# Patient Record
Sex: Male | Born: 1955 | Race: White | Hispanic: No | Smoking: Former smoker
Health system: Southern US, Community
[De-identification: ages and names within clinical notes are randomized; demographics above are authoritative.]

## PROBLEM LIST (undated history)

## (undated) DIAGNOSIS — Z9001 Acquired absence of eye: Secondary | ICD-10-CM

## (undated) DIAGNOSIS — N390 Urinary tract infection, site not specified: Secondary | ICD-10-CM

## (undated) DIAGNOSIS — I82409 Acute embolism and thrombosis of unspecified deep veins of unspecified lower extremity: Secondary | ICD-10-CM

## (undated) DIAGNOSIS — R410 Disorientation, unspecified: Secondary | ICD-10-CM

## (undated) DIAGNOSIS — F1010 Alcohol abuse, uncomplicated: Secondary | ICD-10-CM

## (undated) DIAGNOSIS — J449 Chronic obstructive pulmonary disease, unspecified: Secondary | ICD-10-CM

## (undated) DIAGNOSIS — R1310 Dysphagia, unspecified: Secondary | ICD-10-CM

## (undated) DIAGNOSIS — G9341 Metabolic encephalopathy: Secondary | ICD-10-CM

## (undated) DIAGNOSIS — R0600 Dyspnea, unspecified: Secondary | ICD-10-CM

## (undated) DIAGNOSIS — J398 Other specified diseases of upper respiratory tract: Secondary | ICD-10-CM

## (undated) DIAGNOSIS — I739 Peripheral vascular disease, unspecified: Secondary | ICD-10-CM

## (undated) DIAGNOSIS — E119 Type 2 diabetes mellitus without complications: Secondary | ICD-10-CM

## (undated) DIAGNOSIS — J9690 Respiratory failure, unspecified, unspecified whether with hypoxia or hypercapnia: Secondary | ICD-10-CM

## (undated) DIAGNOSIS — I471 Supraventricular tachycardia, unspecified: Secondary | ICD-10-CM

## (undated) DIAGNOSIS — I4891 Unspecified atrial fibrillation: Secondary | ICD-10-CM

## (undated) HISTORY — PX: BELOW KNEE LEG AMPUTATION: SUR23

## (undated) HISTORY — PX: PEG TUBE PLACEMENT: SUR1034

## (undated) HISTORY — PX: TRACHEOSTOMY: SUR1362

---

## 2016-04-21 ENCOUNTER — Emergency Department (HOSPITAL_COMMUNITY): Payer: Medicare Other

## 2016-04-21 ENCOUNTER — Emergency Department (HOSPITAL_COMMUNITY)
Admission: EM | Admit: 2016-04-21 | Discharge: 2016-04-21 | Disposition: A | Discharge status: Home / Self Care | Payer: Medicare Other | Attending: Emergency Medicine | Admitting: Emergency Medicine

## 2016-04-21 ENCOUNTER — Encounter (HOSPITAL_COMMUNITY): Payer: Self-pay | Admitting: Emergency Medicine

## 2016-04-21 DIAGNOSIS — E119 Type 2 diabetes mellitus without complications: Secondary | ICD-10-CM | POA: Insufficient documentation

## 2016-04-21 DIAGNOSIS — Z87891 Personal history of nicotine dependence: Secondary | ICD-10-CM | POA: Insufficient documentation

## 2016-04-21 DIAGNOSIS — J449 Chronic obstructive pulmonary disease, unspecified: Secondary | ICD-10-CM | POA: Insufficient documentation

## 2016-04-21 DIAGNOSIS — Z4659 Encounter for fitting and adjustment of other gastrointestinal appliance and device: Secondary | ICD-10-CM | POA: Diagnosis not present

## 2016-04-21 DIAGNOSIS — Z431 Encounter for attention to gastrostomy: Secondary | ICD-10-CM

## 2016-04-21 HISTORY — DX: Type 2 diabetes mellitus without complications: E11.9

## 2016-04-21 HISTORY — DX: Respiratory failure, unspecified, unspecified whether with hypoxia or hypercapnia: J96.90

## 2016-04-21 HISTORY — DX: Metabolic encephalopathy: G93.41

## 2016-04-21 HISTORY — DX: Alcohol abuse, uncomplicated: F10.10

## 2016-04-21 HISTORY — DX: Unspecified atrial fibrillation: I48.91

## 2016-04-21 HISTORY — DX: Supraventricular tachycardia: I47.1

## 2016-04-21 HISTORY — DX: Other specified diseases of upper respiratory tract: J39.8

## 2016-04-21 HISTORY — DX: Supraventricular tachycardia, unspecified: I47.10

## 2016-04-21 HISTORY — DX: Acute embolism and thrombosis of unspecified deep veins of unspecified lower extremity: I82.409

## 2016-04-21 HISTORY — DX: Acquired absence of eye: Z90.01

## 2016-04-21 HISTORY — DX: Urinary tract infection, site not specified: N39.0

## 2016-04-21 HISTORY — DX: Dysphagia, unspecified: R13.10

## 2016-04-21 HISTORY — DX: Chronic obstructive pulmonary disease, unspecified: J44.9

## 2016-04-21 HISTORY — DX: Peripheral vascular disease, unspecified: I73.9

## 2016-04-21 HISTORY — DX: Disorientation, unspecified: R41.0

## 2016-04-21 LAB — BASIC METABOLIC PANEL
Anion gap: 14 (ref 5–15)
BUN: 38 mg/dL — ABNORMAL HIGH (ref 6–20)
CO2: 27 mmol/L (ref 22–32)
Calcium: 9.7 mg/dL (ref 8.9–10.3)
Chloride: 91 mmol/L — ABNORMAL LOW (ref 101–111)
Creatinine, Ser: 0.9 mg/dL (ref 0.61–1.24)
GFR calc Af Amer: >60 mL/min (ref >60)
GFR calc non Af Amer: >60 mL/min (ref >60)
Glucose, Bld: 187 mg/dL — ABNORMAL HIGH (ref 65–99)
Potassium: 4.6 mmol/L (ref 3.5–5.1)
Sodium: 132 mmol/L — ABNORMAL LOW (ref 135–145)

## 2016-04-21 LAB — CBC WITH DIFFERENTIAL/PLATELET
Basophils Absolute: 0 10*3/uL (ref 0.0–0.1)
Basophils Relative: 0 %
Eosinophils Absolute: 0.1 10*3/uL (ref 0.0–0.7)
Eosinophils Relative: 1 %
HCT: 38.2 % — ABNORMAL LOW (ref 39.0–52.0)
Hemoglobin: 12.5 g/dL — ABNORMAL LOW (ref 13.0–17.0)
Lymphocytes Relative: 24 %
Lymphs Abs: 1.8 10*3/uL (ref 0.7–4.0)
MCH: 28.5 pg (ref 26.0–34.0)
MCHC: 32.7 g/dL (ref 30.0–36.0)
MCV: 87 fL (ref 78.0–100.0)
Monocytes Absolute: 0.6 10*3/uL (ref 0.1–1.0)
Monocytes Relative: 8 %
Neutro Abs: 5.1 10*3/uL (ref 1.7–7.7)
Neutrophils Relative %: 67 %
Platelets: ADEQUATE 10*3/uL (ref 150–400)
RBC: 4.39 MIL/uL (ref 4.22–5.81)
RDW: 15 % (ref 11.5–15.5)
WBC: 7.6 10*3/uL (ref 4.0–10.5)

## 2016-04-21 LAB — CBG MONITORING, ED: Glucose-Capillary: 200 mg/dL — ABNORMAL HIGH (ref 65–99)

## 2016-04-21 MED ORDER — LIDOCAINE HCL 2 % EX GEL
1.0000 "application " | Freq: Once | CUTANEOUS | Status: AC
  Administered 2016-04-21: 1 via TOPICAL
  Filled 2016-04-21: qty 20

## 2016-04-21 MED ORDER — SODIUM CHLORIDE 0.9 % IV BOLUS (SEPSIS)
1000.0000 mL | Freq: Once | INTRAVENOUS | Status: AC
  Administered 2016-04-21: 1000 mL via INTRAVENOUS

## 2016-04-21 NOTE — ED Notes (Signed)
RT present to switch pt over to ED ventilator from [facility].

## 2016-04-21 NOTE — ED Notes (Signed)
Dr Madilyn Hookees made aware NG tube placement difficult due to patient needing to remain NPO, this RN requesting Lidocaine Jelly to do procedure. Dr. Madilyn Hookees verbalized order for lidocaine jelly to this RN.

## 2016-04-21 NOTE — ED Provider Notes (Signed)
[facility]-EMERGENCY DEPT Provider Note   CSN: [id number] Arrival date & time: 04/21/16  1239     History   Chief Complaint Chief Complaint  Patient presents with  . peg tube placement     HPI Eric Sandoval is a 60 y.o. male.  HPI   60 year old male presents today from [facility] for request for PEG tube placement. Patient is a 60 year old male with a significant past medical history of respiratory failure followed by intubation. Patient had a [facility]ach and PEG [facility]16/[facility] and failed multiple attempts at weaning off the ventilator.   Patient reports that 2-3 days ago his PEG tube came out, he is unable to provide any further information surrounding this. Patient reports he has not had any nutrition since then.   Past Medical History:  Diagnosis Date  . Alcohol abuse   . Atrial fibrillation (HCC)   . COPD (chronic obstructive pulmonary disease) (HCC)   . Delirium   . Diabetes mellitus without complication (HCC)   . DVT (deep venous thrombosis) (HCC)   . Dysphagia   . History of eye enucleation    RIGHT  . Metabolic encephalopathy   . Peripheral vascular disease (HCC)   . Respiratory failure (HCC)   . SVT (supraventricular tachycardia) (HCC)   . Tracheomalacia   . UTI (urinary tract infection)    VRE     There are no active problems to display for this patient.   Past Surgical History:  Procedure Laterality Date  . BELOW KNEE LEG AMPUTATION Right   . PEG TUBE PLACEMENT    . TRACHEOSTOMY       Home Medications    Prior to Admission medications   Not on File    Family History No family history on file.  Social History Social History  Substance Use Topics  . Smoking status: Former Games developermoker  . Smokeless tobacco: Not on file  . Alcohol use No     Comment: hx of alcohol abuse previously     Allergies   Review of patient's allergies indicates no known allergies.   Review of Systems Review of Systems    All other systems reviewed and are negative.   Physical Exam Updated Vital Signs BP 110/74   Pulse 95   Resp 18   SpO2 98%   Physical Exam  Constitutional: He is oriented to person, place, and time. He appears well-developed and well-nourished.  HENT:  Head: Normocephalic and atraumatic.  Eyes: Conjunctivae are normal. Pupils are equal, round, and reactive to light. Right eye exhibits no discharge. Left eye exhibits no discharge. No scleral icterus.  Neck: Normal range of motion. No JVD present. No tracheal deviation present.  Tracheostomy patent, no signs of surrounding infection  Cardiovascular: Normal rate.   Pulmonary/Chest: Effort normal. No stridor. No respiratory distress. He has no wheezes. He has no rales.  Abdominal: Soft.  Peg tube port closed, with signs of healing, no discharge or signs of infection   Neurological: He is alert and oriented to person, place, and time. Coordination normal.  Psychiatric: He has a normal mood and affect. His behavior is normal. Judgment and thought content normal.  Nursing note and vitals reviewed.   ED Treatments / Results  Labs (all labs ordered are listed, but only abnormal results are displayed) Labs Reviewed  CBC WITH DIFFERENTIAL/PLATELET - Abnormal; Notable for the following:       Result Value   Hemoglobin 12.5 (*)    HCT  38.2 (*)    All other components within normal limits  BASIC METABOLIC PANEL - Abnormal; Notable for the following:    Sodium 132 (*)    Chloride 91 (*)    Glucose, Bld 187 (*)    BUN 38 (*)    All other components within normal limits    EKG  EKG Interpretation None       Radiology No results found.  Procedures Procedures (including critical care time)  Medications Ordered in ED Medications  sodium chloride 0.9 % bolus 1,000 mL (not administered)     Initial Impression / Assessment and Plan / ED Course  I have reviewed the triage vital signs and the nursing notes.  Pertinent labs &  imaging results that were available during my care of the patient were reviewed by me and considered in my medical decision making (see chart for details).  Clinical Course     Final Clinical Impressions(s) / ED Diagnoses   Final diagnoses:  PEG (percutaneous endoscopic gastrostomy) adjustment/replacement/removal (HCC)    Labs: CBC, BMP  Imaging:   Consults:  Therapeutics:  Discharge Meds:   Assessment/Plan:  60 year old male [facility] for [facility]. I consult that interventional radiology. I spoke with Dr. Sofie Rower who instructed me to contact the secretary for interventional radiology and schedule an appointment for Monday. The scheduler was given information, she reports she would make contact with the patient and schedule procedure for Monday. Patient will have NG tube placed and tell PEG tube placement on Monday.   New Prescriptions New Prescriptions   No medications on file     Eyvonne Mechanic, PA-C 04/21/16 1628    Melene Plan, DO 04/22/16 [phone]

## 2016-04-21 NOTE — ED Notes (Signed)
This RN entered the room when patient's vent began to alarm, pt was found to be unhooked from vent. This RN reattached vent to patient, pts O2 sat 98. Pt then became unresponsive and diaphoretic with eyes rolled back in his head for approx 30 seconds. Pulses remained during episode. Pt appeared to have bowel incontinence during episode. RT in to check ventilator settings. CBG found to be 200, Dr. Madilyn Hookees made aware.

## 2016-04-21 NOTE — Progress Notes (Signed)
Chronic vent pt from [facility].  Matched pt's "home" settings on Servo.  SIMV/PRVC/PS, Vt 550, Rt 12, PS 16.  Pt comfortable.  Will continue to monitor.

## 2016-04-21 NOTE — ED Triage Notes (Signed)
[facility] is [facility] for [facility] Peg [facility] placement. Pt has PICC in right upper arm. Per [facility] pt had peg tube that came out 2-3 days ago.  Per examination site of pervious peg tube is scarred over.  Bowel sounds are present.  VSS NAD.

## 2016-04-21 NOTE — Discharge Instructions (Signed)
Patient was brought to the emergency room for PEG tube placement. Interventional radiology was consult at, they would not be able to place this tube today or this weekend. Patient will need to return on Monday for PEG tube placement. I have talked with secretary for interventional radiology who has patient's contact information. They will be contacting both U and patient's contact phone number to schedule placement on Monday. Patient has had nasogastric tube placed for feedings and medications. If you have any questions or concerns please contact the emergency room, if patient has any new or worsening signs or symptoms please have him return to the emergency room immediately.

## 2016-04-21 NOTE — ED Notes (Signed)
PA Hedges at bedside, pt seen unhooking vent connection from his trach by PA

## 2016-04-25 ENCOUNTER — Telehealth (HOSPITAL_COMMUNITY): Payer: Self-pay

## 2016-04-25 NOTE — Telephone Encounter (Signed)
Called to schedule peg tube placement. Sp[facility] with Pam [facility] and pt had peg tube placed today no need to schedule.

## 2016-05-15 ENCOUNTER — Inpatient Hospital Stay (HOSPITAL_COMMUNITY)
Admission: EM | Admit: 2016-05-15 | Discharge: 2016-05-19 | DRG: 853 | Disposition: A | Discharge status: Skilled Nursing Facility | Payer: Medicare Other | Attending: Internal Medicine | Admitting: Internal Medicine

## 2016-05-15 ENCOUNTER — Emergency Department (HOSPITAL_COMMUNITY): Payer: Medicare Other

## 2016-05-15 ENCOUNTER — Encounter (HOSPITAL_COMMUNITY): Payer: Self-pay

## 2016-05-15 DIAGNOSIS — K219 Gastro-esophageal reflux disease without esophagitis: Secondary | ICD-10-CM | POA: Diagnosis present

## 2016-05-15 DIAGNOSIS — R55 Syncope and collapse: Secondary | ICD-10-CM | POA: Diagnosis present

## 2016-05-15 DIAGNOSIS — Y95 Nosocomial condition: Secondary | ICD-10-CM | POA: Diagnosis present

## 2016-05-15 DIAGNOSIS — R6520 Severe sepsis without septic shock: Secondary | ICD-10-CM | POA: Diagnosis present

## 2016-05-15 DIAGNOSIS — D649 Anemia, unspecified: Secondary | ICD-10-CM | POA: Diagnosis present

## 2016-05-15 DIAGNOSIS — E119 Type 2 diabetes mellitus without complications: Secondary | ICD-10-CM

## 2016-05-15 DIAGNOSIS — J189 Pneumonia, unspecified organism: Secondary | ICD-10-CM | POA: Diagnosis present

## 2016-05-15 DIAGNOSIS — J440 Chronic obstructive pulmonary disease with acute lower respiratory infection: Secondary | ICD-10-CM | POA: Diagnosis present

## 2016-05-15 DIAGNOSIS — E1151 Type 2 diabetes mellitus with diabetic peripheral angiopathy without gangrene: Secondary | ICD-10-CM | POA: Diagnosis present

## 2016-05-15 DIAGNOSIS — Z9911 Dependence on respirator [ventilator] status: Secondary | ICD-10-CM | POA: Diagnosis not present

## 2016-05-15 DIAGNOSIS — T83518A Infection and inflammatory reaction due to other urinary catheter, initial encounter: Secondary | ICD-10-CM | POA: Diagnosis present

## 2016-05-15 DIAGNOSIS — Z931 Gastrostomy status: Secondary | ICD-10-CM

## 2016-05-15 DIAGNOSIS — L89159 Pressure ulcer of sacral region, unspecified stage: Secondary | ICD-10-CM | POA: Diagnosis present

## 2016-05-15 DIAGNOSIS — J9621 Acute and chronic respiratory failure with hypoxia: Secondary | ICD-10-CM | POA: Diagnosis present

## 2016-05-15 DIAGNOSIS — Z794 Long term (current) use of insulin: Secondary | ICD-10-CM

## 2016-05-15 DIAGNOSIS — E860 Dehydration: Secondary | ICD-10-CM | POA: Diagnosis present

## 2016-05-15 DIAGNOSIS — Y838 Other surgical procedures as the cause of abnormal reaction of the patient, or of later complication, without mention of misadventure at the time of the procedure: Secondary | ICD-10-CM | POA: Diagnosis present

## 2016-05-15 DIAGNOSIS — J9611 Chronic respiratory failure with hypoxia: Secondary | ICD-10-CM | POA: Diagnosis not present

## 2016-05-15 DIAGNOSIS — J9811 Atelectasis: Secondary | ICD-10-CM | POA: Insufficient documentation

## 2016-05-15 DIAGNOSIS — Z89511 Acquired absence of right leg below knee: Secondary | ICD-10-CM

## 2016-05-15 DIAGNOSIS — A419 Sepsis, unspecified organism: Secondary | ICD-10-CM | POA: Diagnosis present

## 2016-05-15 DIAGNOSIS — F329 Major depressive disorder, single episode, unspecified: Secondary | ICD-10-CM | POA: Diagnosis present

## 2016-05-15 DIAGNOSIS — T17890A Other foreign object in other parts of respiratory tract causing asphyxiation, initial encounter: Secondary | ICD-10-CM | POA: Diagnosis present

## 2016-05-15 DIAGNOSIS — L89154 Pressure ulcer of sacral region, stage 4: Secondary | ICD-10-CM | POA: Diagnosis present

## 2016-05-15 DIAGNOSIS — F419 Anxiety disorder, unspecified: Secondary | ICD-10-CM | POA: Diagnosis present

## 2016-05-15 DIAGNOSIS — F418 Other specified anxiety disorders: Secondary | ICD-10-CM | POA: Diagnosis not present

## 2016-05-15 DIAGNOSIS — B9620 Unspecified Escherichia coli [E. coli] as the cause of diseases classified elsewhere: Secondary | ICD-10-CM | POA: Diagnosis present

## 2016-05-15 DIAGNOSIS — Z87891 Personal history of nicotine dependence: Secondary | ICD-10-CM

## 2016-05-15 DIAGNOSIS — R0600 Dyspnea, unspecified: Secondary | ICD-10-CM

## 2016-05-15 DIAGNOSIS — Z930 Tracheostomy status: Secondary | ICD-10-CM

## 2016-05-15 DIAGNOSIS — I959 Hypotension, unspecified: Secondary | ICD-10-CM

## 2016-05-15 DIAGNOSIS — R1310 Dysphagia, unspecified: Secondary | ICD-10-CM | POA: Diagnosis present

## 2016-05-15 DIAGNOSIS — I480 Paroxysmal atrial fibrillation: Secondary | ICD-10-CM | POA: Diagnosis present

## 2016-05-15 DIAGNOSIS — Z86718 Personal history of other venous thrombosis and embolism: Secondary | ICD-10-CM

## 2016-05-15 DIAGNOSIS — J9622 Acute and chronic respiratory failure with hypercapnia: Secondary | ICD-10-CM | POA: Diagnosis present

## 2016-05-15 DIAGNOSIS — J9612 Chronic respiratory failure with hypercapnia: Secondary | ICD-10-CM

## 2016-05-15 DIAGNOSIS — N390 Urinary tract infection, site not specified: Secondary | ICD-10-CM

## 2016-05-15 DIAGNOSIS — A4151 Sepsis due to Escherichia coli [E. coli]: Secondary | ICD-10-CM | POA: Diagnosis not present

## 2016-05-15 DIAGNOSIS — Z79899 Other long term (current) drug therapy: Secondary | ICD-10-CM

## 2016-05-15 DIAGNOSIS — J90 Pleural effusion, not elsewhere classified: Secondary | ICD-10-CM

## 2016-05-15 DIAGNOSIS — F32A Depression, unspecified: Secondary | ICD-10-CM | POA: Diagnosis present

## 2016-05-15 DIAGNOSIS — Z7951 Long term (current) use of inhaled steroids: Secondary | ICD-10-CM

## 2016-05-15 HISTORY — DX: Dyspnea, unspecified: R06.00

## 2016-05-15 LAB — URINALYSIS, ROUTINE W REFLEX MICROSCOPIC
BILIRUBIN URINE: NEGATIVE
Glucose, UA: NEGATIVE mg/dL
Ketones, ur: 15 mg/dL — AB
Nitrite: POSITIVE — AB
PH: 5.5 (ref 5.0–8.0)
Protein, ur: 100 mg/dL — AB
SPECIFIC GRAVITY, URINE: 1.023 (ref 1.005–1.030)

## 2016-05-15 LAB — LACTIC ACID, PLASMA
LACTIC ACID, VENOUS: 1.8 mmol/L (ref 0.5–1.9)
LACTIC ACID, VENOUS: 0.7 mmol/L (ref 0.5–1.9)

## 2016-05-15 LAB — COMPREHENSIVE METABOLIC PANEL
ALBUMIN: 2.2 g/dL — AB (ref 3.5–5.0)
ALK PHOS: 138 U/L — AB (ref 38–126)
ALT: 16 U/L — ABNORMAL LOW (ref 17–63)
ANION GAP: 13 (ref 5–15)
AST: 24 U/L (ref 15–41)
BILIRUBIN TOTAL: 0.4 mg/dL (ref 0.3–1.2)
BUN: 39 mg/dL — AB (ref 6–20)
CALCIUM: 9.2 mg/dL (ref 8.9–10.3)
CO2: 30 mmol/L (ref 22–32)
Chloride: 90 mmol/L — ABNORMAL LOW (ref 101–111)
Creatinine, Ser: 0.73 mg/dL (ref 0.61–1.24)
GFR calc Af Amer: >60 mL/min (ref >60)
GLUCOSE: 143 mg/dL — AB (ref 65–99)
Potassium: 5.1 mmol/L (ref 3.5–5.1)
Sodium: 133 mmol/L — ABNORMAL LOW (ref 135–145)
TOTAL PROTEIN: 7.4 g/dL (ref 6.5–8.1)

## 2016-05-15 LAB — CBC WITH DIFFERENTIAL/PLATELET
BASOS PCT: 0 %
Basophils Absolute: 0 10*3/uL (ref 0.0–0.1)
Eosinophils Absolute: 0 10*3/uL (ref 0.0–0.7)
Eosinophils Relative: 0 %
HEMATOCRIT: 30.9 % — AB (ref 39.0–52.0)
HEMOGLOBIN: 9.7 g/dL — AB (ref 13.0–17.0)
LYMPHS ABS: 0.5 10*3/uL — AB (ref 0.7–4.0)
Lymphocytes Relative: 3 %
MCH: 28.4 pg (ref 26.0–34.0)
MCHC: 31.4 g/dL (ref 30.0–36.0)
MCV: 90.4 fL (ref 78.0–100.0)
MONO ABS: 0.7 10*3/uL (ref 0.1–1.0)
MONOS PCT: 4 %
NEUTROS ABS: 18 10*3/uL — AB (ref 1.7–7.7)
NEUTROS PCT: 93 %
Platelets: 384 10*3/uL (ref 150–400)
RBC: 3.42 MIL/uL — ABNORMAL LOW (ref 4.22–5.81)
RDW: 15.5 % (ref 11.5–15.5)
WBC: 19.2 10*3/uL — ABNORMAL HIGH (ref 4.0–10.5)

## 2016-05-15 LAB — PROCALCITONIN: PROCALCITONIN: 1.09 ng/mL

## 2016-05-15 LAB — URINE MICROSCOPIC-ADD ON

## 2016-05-15 LAB — I-STAT TROPONIN, ED: TROPONIN I, POC: 0.01 ng/mL (ref 0.00–0.08)

## 2016-05-15 LAB — I-STAT CG4 LACTIC ACID, ED: Lactic Acid, Venous: 1.74 mmol/L (ref 0.5–1.9)

## 2016-05-15 LAB — TROPONIN I: Troponin I: 0.16 ng/mL (ref <0.03)

## 2016-05-15 LAB — CBG MONITORING, ED: Glucose-Capillary: 152 mg/dL — ABNORMAL HIGH (ref 65–99)

## 2016-05-15 LAB — BRAIN NATRIURETIC PEPTIDE: B NATRIURETIC PEPTIDE 5: 241.7 pg/mL — AB (ref 0.0–100.0)

## 2016-05-15 MED ORDER — MIRTAZAPINE 15 MG PO TABS
15.0000 mg | ORAL_TABLET | Freq: Every day | ORAL | Status: DC
  Administered 2016-05-16 – 2016-05-18 (×4): 15 mg
  Filled 2016-05-15 (×6): qty 1

## 2016-05-15 MED ORDER — FENTANYL CITRATE (PF) 100 MCG/2ML IJ SOLN
50.0000 ug | Freq: Once | INTRAMUSCULAR | Status: AC
  Administered 2016-05-15: 50 ug via INTRAVENOUS
  Filled 2016-05-15: qty 2

## 2016-05-15 MED ORDER — SENNOSIDES-DOCUSATE SODIUM 8.6-50 MG PO TABS
1.0000 | ORAL_TABLET | Freq: Every day | ORAL | Status: DC
  Filled 2016-05-15: qty 1

## 2016-05-15 MED ORDER — CEFEPIME HCL 2 G IJ SOLR
2.0000 g | Freq: Three times a day (TID) | INTRAMUSCULAR | Status: DC
  Administered 2016-05-16 – 2016-05-19 (×9): 2 g via INTRAVENOUS
  Filled 2016-05-15 (×10): qty 2

## 2016-05-15 MED ORDER — LOPERAMIDE HCL 2 MG PO CAPS: 2.0000 mg | ORAL_CAPSULE | Freq: Four times a day (QID) | ORAL | Status: DC | PRN

## 2016-05-15 MED ORDER — POLYETHYLENE GLYCOL 3350 17 G PO PACK: 17.0000 g | PACK | Freq: Every day | ORAL | Status: DC | PRN

## 2016-05-15 MED ORDER — SODIUM CHLORIDE 0.9 % IV BOLUS (SEPSIS)
1000.0000 mL | Freq: Once | INTRAVENOUS | Status: AC
  Administered 2016-05-15: 1000 mL via INTRAVENOUS

## 2016-05-15 MED ORDER — ACETAMINOPHEN 325 MG PO TABS
650.0000 mg | ORAL_TABLET | Freq: Four times a day (QID) | ORAL | Status: DC | PRN
  Administered 2016-05-16 – 2016-05-17 (×2): 650 mg
  Filled 2016-05-15 (×2): qty 2

## 2016-05-15 MED ORDER — INSULIN ASPART 100 UNIT/ML ~~LOC~~ SOLN
0.0000 [IU] | Freq: Four times a day (QID) | SUBCUTANEOUS | Status: DC
  Administered 2016-05-15 – 2016-05-16 (×2): 2 [IU] via SUBCUTANEOUS
  Administered 2016-05-16: 1 [IU] via SUBCUTANEOUS
  Administered 2016-05-16: 3 [IU] via SUBCUTANEOUS
  Administered 2016-05-17: 1 [IU] via SUBCUTANEOUS
  Administered 2016-05-17: 2 [IU] via SUBCUTANEOUS
  Administered 2016-05-17: 1 [IU] via SUBCUTANEOUS
  Filled 2016-05-15 (×3): qty 1

## 2016-05-15 MED ORDER — LACTULOSE 10 GM/15ML PO SOLN
20.0000 g | Freq: Every day | ORAL | Status: DC | PRN
  Filled 2016-05-15: qty 30

## 2016-05-15 MED ORDER — ALBUTEROL SULFATE (2.5 MG/3ML) 0.083% IN NEBU
2.5000 mg | INHALATION_SOLUTION | Freq: Four times a day (QID) | RESPIRATORY_TRACT | Status: DC
  Administered 2016-05-16 – 2016-05-19 (×13): 2.5 mg via RESPIRATORY_TRACT
  Filled 2016-05-15 (×13): qty 3

## 2016-05-15 MED ORDER — PREDNISONE 10 MG PO TABS
5.0000 mg | ORAL_TABLET | Freq: Every day | ORAL | Status: DC
  Administered 2016-05-16 – 2016-05-19 (×4): 5 mg
  Filled 2016-05-15 (×4): qty 1

## 2016-05-15 MED ORDER — CHLORHEXIDINE GLUCONATE 0.12 % MT SOLN: 15.0000 mL | Freq: Three times a day (TID) | OROMUCOSAL | Status: DC

## 2016-05-15 MED ORDER — ZINC SULFATE 220 (50 ZN) MG PO CAPS
220.0000 mg | ORAL_CAPSULE | Freq: Every day | ORAL | Status: DC
  Administered 2016-05-16 – 2016-05-19 (×4): 220 mg via ORAL
  Filled 2016-05-15 (×4): qty 1

## 2016-05-15 MED ORDER — DEXTROSE 5 % IV SOLN
2.0000 g | Freq: Once | INTRAVENOUS | Status: AC
  Administered 2016-05-15: 2 g via INTRAVENOUS
  Filled 2016-05-15: qty 2

## 2016-05-15 MED ORDER — ONDANSETRON HCL 4 MG PO TABS: 4.0000 mg | ORAL_TABLET | Freq: Four times a day (QID) | ORAL | Status: DC | PRN

## 2016-05-15 MED ORDER — CHLORHEXIDINE GLUCONATE 0.12% ORAL RINSE (MEDLINE KIT)
15.0000 mL | Freq: Two times a day (BID) | OROMUCOSAL | Status: DC
  Administered 2016-05-16 – 2016-05-19 (×5): 15 mL via OROMUCOSAL

## 2016-05-15 MED ORDER — FAMOTIDINE 20 MG PO TABS
20.0000 mg | ORAL_TABLET | Freq: Two times a day (BID) | ORAL | Status: DC
  Administered 2016-05-16 – 2016-05-19 (×8): 20 mg via ORAL
  Filled 2016-05-15 (×9): qty 1

## 2016-05-15 MED ORDER — CITALOPRAM HYDROBROMIDE 20 MG PO TABS
10.0000 mg | ORAL_TABLET | Freq: Every day | ORAL | Status: DC
  Administered 2016-05-16 – 2016-05-19 (×4): 10 mg
  Filled 2016-05-15 (×4): qty 1

## 2016-05-15 MED ORDER — ORAL CARE MOUTH RINSE
15.0000 mL | Freq: Four times a day (QID) | OROMUCOSAL | Status: DC
  Administered 2016-05-16 – 2016-05-19 (×10): 15 mL via OROMUCOSAL

## 2016-05-15 MED ORDER — SODIUM CHLORIDE 0.9 % IV BOLUS (SEPSIS)
500.0000 mL | Freq: Once | INTRAVENOUS | Status: AC
  Administered 2016-05-15: 500 mL via INTRAVENOUS

## 2016-05-15 MED ORDER — SCOPOLAMINE 1 MG/3DAYS TD PT72
1.0000 | MEDICATED_PATCH | TRANSDERMAL | Status: DC
  Administered 2016-05-16 – 2016-05-19 (×2): 1.5 mg via TRANSDERMAL
  Filled 2016-05-15 (×2): qty 1

## 2016-05-15 MED ORDER — ALBUTEROL SULFATE (2.5 MG/3ML) 0.083% IN NEBU: 1.2500 mg | INHALATION_SOLUTION | RESPIRATORY_TRACT | Status: DC | PRN

## 2016-05-15 MED ORDER — FOLIC ACID 1 MG PO TABS
1.0000 mg | ORAL_TABLET | Freq: Every day | ORAL | Status: DC
  Administered 2016-05-16 – 2016-05-19 (×4): 1 mg
  Filled 2016-05-15 (×4): qty 1

## 2016-05-15 MED ORDER — VANCOMYCIN HCL IN DEXTROSE 1-5 GM/200ML-% IV SOLN: 1000.0000 mg | Freq: Once | INTRAVENOUS | Status: DC

## 2016-05-15 MED ORDER — CLONAZEPAM 1 MG PO TABS
2.0000 mg | ORAL_TABLET | Freq: Three times a day (TID) | ORAL | Status: DC | PRN
  Administered 2016-05-16 – 2016-05-19 (×5): 2 mg
  Filled 2016-05-15 (×5): qty 2

## 2016-05-15 MED ORDER — VALPROATE SODIUM 250 MG/5ML PO SOLN
125.0000 mg | Freq: Two times a day (BID) | ORAL | Status: DC
  Administered 2016-05-16 – 2016-05-19 (×8): 125 mg via ORAL
  Filled 2016-05-15: qty 5
  Filled 2016-05-15: qty 2.5
  Filled 2016-05-15 (×5): qty 5
  Filled 2016-05-15: qty 2.5
  Filled 2016-05-15 (×3): qty 5

## 2016-05-15 MED ORDER — VITAMIN C 500 MG PO TABS
500.0000 mg | ORAL_TABLET | Freq: Every day | ORAL | Status: DC
  Administered 2016-05-16 – 2016-05-19 (×4): 500 mg
  Filled 2016-05-15 (×4): qty 1

## 2016-05-15 MED ORDER — VITAMIN B-1 100 MG PO TABS
100.0000 mg | ORAL_TABLET | Freq: Every day | ORAL | Status: DC
  Administered 2016-05-16 – 2016-05-19 (×4): 100 mg
  Filled 2016-05-15 (×4): qty 1

## 2016-05-15 MED ORDER — VANCOMYCIN HCL IN DEXTROSE 1-5 GM/200ML-% IV SOLN
1000.0000 mg | Freq: Two times a day (BID) | INTRAVENOUS | Status: DC
  Administered 2016-05-16 – 2016-05-17 (×3): 1000 mg via INTRAVENOUS
  Filled 2016-05-15 (×4): qty 200

## 2016-05-15 MED ORDER — DEXTROSE-NACL 5-0.45 % IV SOLN
INTRAVENOUS | Status: DC
  Administered 2016-05-16: 03:00:00 via INTRAVENOUS

## 2016-05-15 MED ORDER — FENTANYL CITRATE (PF) 100 MCG/2ML IJ SOLN
50.0000 ug | Freq: Once | INTRAMUSCULAR | Status: AC
  Administered 2016-05-15: 50 ug via INTRAVENOUS

## 2016-05-15 MED ORDER — VANCOMYCIN HCL 10 G IV SOLR
1500.0000 mg | Freq: Once | INTRAVENOUS | Status: AC
  Administered 2016-05-15: 1500 mg via INTRAVENOUS
  Filled 2016-05-15: qty 1500

## 2016-05-15 MED ORDER — ENOXAPARIN SODIUM 40 MG/0.4ML ~~LOC~~ SOLN
40.0000 mg | SUBCUTANEOUS | Status: DC
  Administered 2016-05-16 – 2016-05-19 (×4): 40 mg via SUBCUTANEOUS
  Filled 2016-05-15 (×4): qty 0.4

## 2016-05-15 MED ORDER — VALPROATE SODIUM 250 MG/5ML PO SOLN
500.0000 mg | Freq: Every day | ORAL | Status: DC
  Administered 2016-05-16 – 2016-05-18 (×4): 500 mg
  Filled 2016-05-15 (×7): qty 10

## 2016-05-15 MED ORDER — ACETYLCYSTEINE 20 % IN SOLN
3.0000 mL | Freq: Two times a day (BID) | RESPIRATORY_TRACT | Status: DC
  Administered 2016-05-16: 4 mL via RESPIRATORY_TRACT
  Administered 2016-05-16 – 2016-05-17 (×2): 3 mL via RESPIRATORY_TRACT
  Filled 2016-05-15 (×3): qty 4

## 2016-05-15 MED ORDER — CARVEDILOL 12.5 MG PO TABS
12.5000 mg | ORAL_TABLET | Freq: Two times a day (BID) | ORAL | Status: DC
  Administered 2016-05-16 – 2016-05-19 (×6): 12.5 mg
  Filled 2016-05-15 (×8): qty 1

## 2016-05-15 MED ORDER — ONDANSETRON HCL 4 MG/2ML IJ SOLN: 4.0000 mg | Freq: Four times a day (QID) | INTRAMUSCULAR | Status: DC | PRN

## 2016-05-15 NOTE — ED Notes (Addendum)
Pt disconnected self from vent a second time. O2 sats dropped to 62%. This RN reconnected the vent and increased O2 concentration for 1 minute until O2 sats increased to 99%. Reminded pt importance of keeping vent connected to trach.

## 2016-05-15 NOTE — ED Notes (Signed)
Pt disconnected self from vent. SpO2 decreased to 40-50%. This RN reminded pt of importance of leaving vent connected to trach. This RN reconnected vent and increased O2 concentration for 1 minute until SpO2 improved to 95-99%. Pt nodded yes to understanding request to leave vent connected.

## 2016-05-15 NOTE — Procedures (Signed)
Bronchoscopy Procedure Note Laban Emperornthony Keator [id number] 08/25/1955  Procedure: Bronchoscopy Indications: Diagnostic evaluation of the airways and Remove secretions  Procedure Details Consent: Risks of procedure as well as the alternatives and risks of each were explained to the (patient/caregiver).  Consent for procedure obtained. Verbally from the patient, he is unable to sign since vent dependent. Procedure was emergent due to cardiac arrest with left hemithorax whiteout on chest x-ray  Time Out: Verified patient identification, verified procedure, site/side was marked, verified correct patient position, special equipment/implants available, medications/allergies/relevent history reviewed, required imaging and test results available.  Performed  In preparation for procedure, patient was given 100% FiO2 and bronchoscope lubricated. Sedation: fentnayl 100 mcg in divided doses  Airway entered and the following bronchi were examined: Bronchi.   Procedures performed: BAL performed. Thick inspissated purulent secretions in the left mainstem bronchus were suctioned out over 5-10 minutes with lavage. Bronchoscope removed.  , Patient placed back on 100% FiO2 at conclusion of procedure.    Evaluation Hemodynamic Status: BP stable throughout; O2 sats: stable throughout Patient's Current Condition: stable Specimens:  Sent purulent fluid for culture Complications: No apparent complications Patient did tolerate procedure well.   Lorin Hauck V. 05/15/2016

## 2016-05-15 NOTE — H&P (Addendum)
History and Physical    Dathan Attia WUJ:[id number] DOB: [birth year] DOA: 05/15/2016  Referring MD/NP/PA: Lake Bells PA-C  PCP: No primary care provider on file.  Patient coming from: [facility]  Chief Complaint: Unresponsiveness  HPI: Eric Sandoval is a 60 y.o. male with medical history significant of chronic respiratory failure, s/p tracheostomy, A. fib with  SVT, dysphagiam, s/p PEG, and iabetes mellitus type 2; who presents after having an episode of unresponsiveness. Patient has been residing at [facility] and was noted to be unresponsive without pulse at approximately 2:25 PM. CPR was initiated and patient was given 1 dose of epinephrine around 2:28pm.  The patient was noted to regain consciousness and return of spontaneous circulation at approximately 2:29 pm. Upon EMS arrival patient was noted to be hypotensive, but was able to follow commands and mouth words.  ED Course: Upon admission into the emergency department patient was seen to be afebrile, pulse 81-91, blood pressure as low as 81/71, and O2 sats 88 -100%.  Lab work revealed WBC 19.2, hemoglobin 9.7, Plts 384, sodium 133, potassium 5.1, chloride 90, BUN 39, creatinine 0.73, calcium 9.2, glucose 143, lactic acid 1.74, and trop 0.01. Urinalysis many bacteria, large hemoglobin, large leukocyte esterase, positive nitrites, 0-5 squamous epithelial cells, TNTC WBCs. Chest x-ray shows complete opacification of the left hemithorax related to possibility of mucous plugging. PCCM was consulted for vent management. Sepsis protocol initiated and patient was started on vancomycin and cefepime.  Review of Systems: As per HPI otherwise 10 point review of systems negative.   Past Medical History:  Diagnosis Date  . Alcohol abuse   . Atrial fibrillation (HCC)   . COPD (chronic obstructive pulmonary disease) (HCC)   . Delirium   . Diabetes mellitus without complication (HCC)   . DVT (deep venous thrombosis) (HCC)   . Dysphagia     . History of eye enucleation    RIGHT  . Metabolic encephalopathy   . Peripheral vascular disease (HCC)   . Respiratory failure (HCC)   . SVT (supraventricular tachycardia) (HCC)   . Tracheomalacia   . UTI (urinary tract infection)    VRE     Past Surgical History:  Procedure Laterality Date  . BELOW KNEE LEG AMPUTATION Right   . PEG TUBE PLACEMENT    . TRACHEOSTOMY       reports that he has quit smoking. He has never used smokeless tobacco. He reports that he does not drink alcohol. His drug history is not on file.  No Known Allergies  History reviewed. No pertinent family history.  Prior to Admission medications   Medication Sig Start Date End Date Taking? Authorizing Provider  acetaminophen (TYLENOL) 325 MG tablet Place 650 mg into feeding tube every 6 (six) hours as needed for moderate pain.     Historical Provider, MD  albuterol (ACCUNEB) 1.25 MG/3ML nebulizer solution Take 1 ampule by nebulization every 4 (four) hours as needed for wheezing or shortness of breath.    Historical Provider, MD  carvedilol (COREG) 12.5 MG tablet Place 12.5 mg into feeding tube 2 (two) times daily with a meal.    Historical Provider, MD  chlorhexidine (PERIDEX) 0.12 % solution Use as directed 15 mLs in the mouth or throat 3 (three) times daily. For trach tube    Historical Provider, MD  citalopram (CELEXA) 10 MG tablet Place 10 mg into feeding tube daily.    Historical Provider, MD  clonazePAM (KLONOPIN) 2 MG tablet Place 2 mg into feeding tube 3 (three)  times daily as needed for anxiety.    Historical Provider, MD  dextrose 5 % solution Inject 50 mLs into the vein continuous.    Historical Provider, MD  enoxaparin (LOVENOX) 40 MG/0.4ML injection Inject 40 mg into the skin daily.    Historical Provider, MD  famotidine (PEPCID) 20 MG tablet Take 20 mg by mouth 2 (two) times daily.    Historical Provider, MD  folic acid (FOLVITE) 1 MG tablet Place 1 mg into feeding tube daily.    Historical  Provider, MD  insulin regular (NOVOLIN R,HUMULIN R) 100 units/mL injection Inject 0-10 Units into the skin 3 (three) times daily before meals. sliding scale    Historical Provider, MD  lactulose (CHRONULAC) 10 GM/15ML solution Place 20 g into feeding tube daily as needed for moderate constipation.    Historical Provider, MD  loperamide (IMODIUM) 2 MG capsule Take 2 mg by mouth every 6 (six) hours as needed for diarrhea or loose stools.    Historical Provider, MD  mirtazapine (REMERON) 15 MG tablet Place 15 mg into feeding tube at bedtime.    Historical Provider, MD  Multiple Vitamin (MULTIVITAMIN WITH MINERALS) TABS tablet Place 1 tablet into feeding tube daily.    Historical Provider, MD  ondansetron (ZOFRAN) 4 MG/2ML SOLN injection Inject 4 mg into the vein once.    Historical Provider, MD  polyethylene glycol (MIRALAX / GLYCOLAX) packet Place 17 g into feeding tube daily as needed for moderate constipation (hold for diarrhea).    Historical Provider, MD  predniSONE (DELTASONE) 5 MG tablet Place 5 mg into feeding tube daily with breakfast.     Historical Provider, MD  scopolamine (TRANSDERM-SCOP, 1.5 MG,) 1 MG/3DAYS Place 1 patch onto the skin every 3 (three) days. For increased secretions    Historical Provider, MD  senna-docusate (SENOKOT-S) 8.6-50 MG tablet Place 1 tablet into feeding tube daily.    Historical Provider, MD  sodium chloride flush 0.9 % SOLN injection Inject 10 mLs into the vein every 6 (six) hours.    Historical Provider, MD  thiamine 100 MG tablet Place 100 mg into feeding tube daily.    Historical Provider, MD  traMADol (ULTRAM) 50 MG tablet Take 50 mg by mouth every 8 (eight) hours.    Historical Provider, MD  Valproate Sodium (DEPAKENE) 250 MG/5ML SOLN solution Take 125 mg by mouth 2 (two) times daily.    Historical Provider, MD  Valproate Sodium (DEPAKENE) 250 MG/5ML SOLN solution Place 500 mg into feeding tube at bedtime.    Historical Provider, MD  vitamin C (ASCORBIC  ACID) 500 MG tablet Place 500 mg into feeding tube daily.    Historical Provider, MD  zinc sulfate 220 (50 Zn) MG capsule Take 220 mg by mouth daily.    Historical Provider, MD    Physical Exam:    Constitutional: Chronically ill-appearing, but alert and able to follow commands mildly words Vitals:   05/15/16 1800 05/15/16 1815 05/15/16 1830 05/15/16 1900  BP: 101/59 103/58 100/62 104/62  Pulse: 82 82 81 81  Resp: 17 19 18 18   Temp:      TempSrc:      SpO2: 96% 95% 97% 95%   Eyes: Conjunctival injection of left eye, Right eye avulsion ENMT: Mucous membranes are dry. Poor dentition.  Neck: Tracheostomy in place no JVD appreciated Respiratory:  Coarse breath sounds Bilaterally, but notably decreased over the left lower lung field. Rhonchi present bilaterally, and no wheezes appreciated. Cardiovascular: Regular rate and rhythm, no murmurs /  rubs / gallops. No extremity edema. 2+ pedal pulses. No carotid bruits. PICC line in RUE Abdomen: no tenderness, no masses palpated. No hepatosplenomegaly. Bowel sounds positive. PEG tube in place  Musculoskeletal: no clubbing / cyanosis. No joint deformity upper and lower extremities. Right BKA. Skin: Sacral decubitus stage IV Neurologic: CN 2-12 grossly intact. Sensation intact, DTR normal. Strength 5/5 in all 4.  Psychiatric: Patient alert and able to follow simple commands and mouth words as well as shake his head yes and no.    Labs on Admission: I have personally reviewed following labs and imaging studies  CBC:  Recent Labs Lab 05/15/16 1608  WBC 19.2*  NEUTROABS 18.0*  HGB 9.7*  HCT 30.9*  MCV 90.4  PLT 384   Basic Metabolic Panel:  Recent Labs Lab 05/15/16 1608  NA 133*  K 5.1  CL 90*  CO2 30  GLUCOSE 143*  BUN 39*  CREATININE 0.73  CALCIUM 9.2   GFR: CrCl cannot be calculated (Unknown ideal weight.). Liver Function Tests:  Recent Labs Lab 05/15/16 1608  AST 24  ALT 16*  ALKPHOS 138*  BILITOT 0.4  PROT  7.4  ALBUMIN 2.2*   No results for input(s): LIPASE, AMYLASE in the last 168 hours. No results for input(s): AMMONIA in the last 168 hours. Coagulation Profile: No results for input(s): INR, PROTIME in the last 168 hours. Cardiac Enzymes: No results for input(s): CKTOTAL, CKMB, CKMBINDEX, TROPONINI in the last 168 hours. BNP (last 3 results) No results for input(s): PROBNP in the last 8760 hours. HbA1C: No results for input(s): HGBA1C in the last 72 hours. CBG: No results for input(s): GLUCAP in the last 168 hours. Lipid Profile: No results for input(s): CHOL, HDL, LDLCALC, TRIG, CHOLHDL, LDLDIRECT in the last 72 hours. Thyroid Function Tests: No results for input(s): TSH, T4TOTAL, FREET4, T3FREE, THYROIDAB in the last 72 hours. Anemia Panel: No results for input(s): VITAMINB12, FOLATE, FERRITIN, TIBC, IRON, RETICCTPCT in the last 72 hours. Urine analysis:    Component Value Date/Time   COLORURINE AMBER (A) 05/15/2016 1700   APPEARANCEUR CLOUDY (A) 05/15/2016 1700   LABSPEC 1.023 05/15/2016 1700   PHURINE 5.5 05/15/2016 1700   GLUCOSEU NEGATIVE 05/15/2016 1700   HGBUR LARGE (A) 05/15/2016 1700   BILIRUBINUR NEGATIVE 05/15/2016 1700   KETONESUR 15 (A) 05/15/2016 1700   PROTEINUR 100 (A) 05/15/2016 1700   NITRITE POSITIVE (A) 05/15/2016 1700   LEUKOCYTESUR LARGE (A) 05/15/2016 1700   Sepsis Labs: No results found for this or any previous visit (from the past 240 hour(s)).   Radiological Exams on Admission: Dg Chest Port 1 View  Result Date: 05/15/2016 CLINICAL DATA:  Unresponsive. Ventilated patient with tracheostomy tube. EXAM: PORTABLE CHEST 1 VIEW COMPARISON:  None. FINDINGS: Complete opacification of left hemithorax. There appears to be volume loss in left hemithorax suggesting left lung collapse and possibly related to mucous plugging. However, large left pleural effusion cannot be excluded. Diffuse airspace densities in the right lung may represent edema. PICC line tip  near the superior cavoatrial junction. Tracheostomy tube is present. Heart is obscured by the left chest densities. IMPRESSION: Complete opacification of the left hemithorax probably related to collapse of the left lung from mucous plugging. Massive left pleural effusion cannot be excluded. Right lung airspace disease may represent pulmonary edema. Electronically Signed   By: Richarda Overlie M.D.   On: 05/15/2016 16:26    EKG: Independently reviewed. Sinus rhythm with abnormal  Assessment/Plan Sepsis 2/2 suspectedComplicated UTI /HCAP: Acute. Patient  presents loss of consciousness WBC elevated at 19.2 with a positive urinalysis and abnormal chest x-ray. Patient was given full dose fluid bolus per sepsis protocol and started on vancomycin and cefepime given patient living in long-term care facility. - Admit to Stepdown - Sepsis protocol initiated - Follow up blood, urine, sputum culture - Continue empiric antibiotics of cefepime and vancomycin - Will need to change out Foley at some point  Chronic respiratory failure with hypoxia and hypercapnia s/p tracheostomy: Patient chronically vent dependent no change in settings currently at this time. [facility] - Continue scopolamine patch, prednisone, albuterol breathing treatment - appreciate PCCM consultation, per PCCM for vent management   Unresponsiveness/ question cardiac and/or respiratory arrest: Resolved. Patient apparently had reportedly lost pulse and consciousness. Underwent a short course of CPR with return of spontaneous circulation. Initial troponin 0.01. Suspect patient could have had because both hands acute cause of respiratory arrest although patient also has a history of SVT. - Continue to monitor - Trend cardiac troponins  Dysphagia S/p PEG - Continue tube feedings per [facility]   Dehydration: Patient presents with elevated BUN to creatinine ratio. Patient also found to have low chloride and sodium levels. - Continue D5 -1/2 NS IV  fluids  Chronic indwelling foley catheter - As seen above  Paroxysmal atrial fibrillation with history of SVT - Continue Coreg  Diabetes mellitus type 2 - Hypoglycemic protocols - CBG q 6hrs with sensitive sliding scale insulin  History of alcohol abuse - Continue thiamine and folic acid  Anxiety/MDD - Continue Celexa, valprorate sodium, mirtazapine Klonopin prn,   Sacral decubitus wound - Low air loss mattress replacement - Wound care consult  Anemia: Chronic. Hemoglobin on 9.7 which appears near patient's baseline per review of records from [facility] where patient's hemoglobin was approximately 10 on 11/10. - Continue to monitor  Genella RifeGerd - continue Pepcid DVT prophylaxis: Lovenox Code Status: FULL Family Communication: No family present at bedside Disposition Plan: Likely discharge back to [facility] once medically stable Consults called: PCCM Admission status: Inpatient  Clydie Braunondell A Cassiel Fernandez MD [facility] Pager [phone]  If 7PM-7AM, please contact night-coverage [email or web address] Password [email or web address]  05/15/2016, 7:24 PM

## 2016-05-15 NOTE — ED Notes (Signed)
Pt removed omniflex piece from trach, O2 sats dropped to 60%. RT called to confirm correct placement. O2 concentration increased to 100% for 1 minute until sats at 100%. Pt reports the vent keeps falling off, called to have mittens sent to ED to prevent pt from intentionally removing vent.

## 2016-05-15 NOTE — ED Provider Notes (Signed)
Pt is a 60 y.o. male who presents with  Chief Complaint  Patient presents with  . Loss of Consciousness  Pt presented to the ED after an episode of LOC at the nursing facility.  They were unable to obtain pulses.  They initiated CPR and gave a dose of epinephrine through his PEG tube.  Pt regained consciousness.  When EMS arrived, they found him alert and awake.  Pt is hypotensive.  Pt has difficulty answering questions because of his trach but he does follow commands and mouth words.  Physical Exam  Constitutional: No distress.  HENT:  Head: Normocephalic and atraumatic.  Mouth/Throat: No oropharyngeal exudate.  Eyes: Conjunctivae are normal. Left eye exhibits no discharge. No scleral icterus.  Neck: No tracheal deviation present. No thyromegaly present.  Trach in place  Cardiovascular: Normal rate, regular rhythm and normal heart sounds.   Pulmonary/Chest: Effort normal and breath sounds normal. No stridor.  Abdominal: He exhibits no distension. There is no tenderness. There is no rebound.  g tube in place  Musculoskeletal: He exhibits no tenderness or deformity.  picc line RUE, sacral decub stage 4  Neurological: He is alert. He is not agitated and not disoriented. No cranial nerve deficit.  Orientation is difficult to assess, [facility]  Skin: Skin is warm. No rash noted. He is diaphoretic. No erythema.  Psychiatric: Affect normal.    Clinical Course      EKG Interpretation  Date/Time:  Monday May 15 2016 15:41:45 EST Ventricular Rate:  86 PR Interval:    QRS Duration: 94 QT Interval:  368 QTC Calculation: 441 R Axis:   57 Text Interpretation:  Sinus rhythm Abnormal R-wave progression, early transition No old tracing to compare Confirmed by Wai Litt  MD-J, Fleeta Kunde ([id number]) on 05/15/2016 3:50:18 PM      Pt treated for possible sepsis.  Sx improved in the ED with treatment.  Critical care consulted.  Recommended admission to the medical service.  1. Pleural  effusion on left   2. Syncope   3. Syncope and collapse   4. Urinary tract infection without hematuria, site unspecified   5. Hypotension, unspecified hypotension type     I saw and evaluated the patient, reviewed the resident's note and I agree with the findings and plan.    Linwood DibblesJon Jehan Bonano, MD 05/16/16 [phone]

## 2016-05-15 NOTE — ED Provider Notes (Signed)
[facility] DEPT Provider Note   CSN: [id number] Arrival date & time: 05/15/16  1528     History   Chief Complaint Chief Complaint  Patient presents with  . Loss of Consciousness    HPI Eric Sandoval is a 60 y.o. male.  HPI Patient is a 60 year old male with history of chronic respiratory failure, status post trach and vent dependent, status post PEG tube who presents as a transfer from [facility] for evaluation after an unresponsive episode. Unable to obtain further history from patient as he is chronically trach and unable to speak. History obtained from EMS and medical records. Per report patient was receiving wound care for a sacral ulcer when he was rolled on one side and became unresponsive. Per facility report patient lost pulses. He received about 1 minute of CPR and received 1 epi per PICC line with return of pulses. Blood pressure is 112/76. On EMS arrival patient was mildly hypertensive and received 200 mL of IV fluids through his PICC line. Per report facility denies recent fevers, cough, increased secretions from his trach or other infectious symptoms. Past Medical History:  Diagnosis Date  . Alcohol abuse   . Atrial fibrillation ([place])   . COPD (chronic obstructive pulmonary disease) ([place])   . Delirium   . Diabetes mellitus without complication ([place])   . DVT (deep venous thrombosis) ([place])   . Dysphagia   . History of eye enucleation    RIGHT  . Metabolic encephalopathy   . Peripheral vascular disease ([place])   . Respiratory failure ([place])   . SVT (supraventricular tachycardia) ([place])   . Tracheomalacia   . UTI (urinary tract infection)    VRE     Patient Active Problem List   Diagnosis Date Noted  . Sepsis ([place]) 05/15/2016  . Chronic respiratory failure with hypoxia and hypercapnia ([place]) 05/15/2016  . Dysphagia 05/15/2016  . S/P percutaneous endoscopic gastrostomy (PEG) tube placement ([place]) 05/15/2016  . 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  Dehydration 05/15/2016  . Diabetes mellitus type 2, insulin dependent ([place]) 05/15/2016  . Anxiety and depression 05/15/2016  . Sacral decubitus ulcer 05/15/2016  . Anemia 05/15/2016  . Complicated UTI (urinary tract infection) 05/15/2016  . HCAP (healthcare-associated pneumonia) 05/15/2016  . Atelectasis of left lung     Past Surgical History:  Procedure Laterality Date  . BELOW KNEE LEG AMPUTATION Right   . PEG TUBE PLACEMENT    . TRACHEOSTOMY         Home Medications    Prior to Admission medications   Medication Sig Start Date End Date Taking? Authorizing Provider  acetaminophen (TYLENOL) 325 MG tablet Place 650 mg into feeding tube every 6 (six) hours as needed for moderate pain.    Yes Historical Provider, MD  albuterol (ACCUNEB) 1.25 MG/3ML nebulizer solution Take 1 ampule by nebulization every 4 (four) hours as needed for wheezing or shortness of breath.   Yes Historical Provider, MD  carvedilol (COREG) 12.5 MG tablet Place 12.5 mg into feeding tube 2 (two) times daily with a meal.   Yes Historical Provider, MD  chlorhexidine (PERIDEX) 0.12 % solution Use as directed 15 mLs in the mouth or throat 3 (three) times daily. For trach tube   Yes Historical Provider, MD  citalopram (CELEXA) 10 MG tablet Place 10 mg into feeding tube daily.   Yes Historical Provider, MD  clonazePAM (KLONOPIN) 2 MG tablet Place 2 mg into feeding tube 3 (three) times daily as needed for anxiety.   Yes Historical  Provider, MD  enoxaparin (LOVENOX) 40 MG/0.4ML injection Inject 40 mg into the skin daily.   Yes Historical Provider, MD  famotidine (PEPCID) 20 MG tablet Place 20 mg into feeding tube 2 (two) times daily.    Yes Historical Provider, MD  folic acid (FOLVITE) 1 MG tablet Place 1 mg into feeding tube daily.   Yes Historical Provider, MD  insulin aspart (NOVOLOG) 100 UNIT/ML injection Inject 14 Units into the skin 2 (two) times daily.   Yes Historical Provider, MD  insulin regular (NOVOLIN  R,HUMULIN R) 100 units/mL injection Inject 0-10 Units into the skin 3 (three) times daily before meals. sliding scale   Yes Historical Provider, MD  lactulose (CHRONULAC) 10 GM/15ML solution Place 20 g into feeding tube daily as needed for moderate constipation.   Yes Historical Provider, MD  loperamide (IMODIUM) 2 MG capsule Take 2 mg by mouth every 6 (six) hours as needed for diarrhea or loose stools.   Yes Historical Provider, MD  mirtazapine (REMERON) 7.5 MG tablet Place 7.5 mg into feeding tube at bedtime.   Yes Historical Provider, MD  Multiple Vitamin (MULTIVITAMIN WITH MINERALS) TABS tablet Place 1 tablet into feeding tube daily.   Yes Historical Provider, MD  ondansetron (ZOFRAN) 4 MG/2ML SOLN injection Inject 4 mg into the vein every 6 (six) hours as needed for nausea or vomiting.    Yes Historical Provider, MD  polyethylene glycol (MIRALAX / GLYCOLAX) packet Place 17 g into feeding tube daily as needed for moderate constipation (hold for diarrhea).   Yes Historical Provider, MD  predniSONE (DELTASONE) 5 MG tablet Place 15 mg into feeding tube daily with breakfast.    Yes Historical Provider, MD  Protein POWD Give 1 scoop by tube every 6 (six) hours.   Yes Historical Provider, MD  scopolamine (TRANSDERM-SCOP, 1.5 MG,) 1 MG/3DAYS Place 1 patch onto the skin every 3 (three) days. For increased secretions   Yes Historical Provider, MD  senna-docusate (SENOKOT-S) 8.6-50 MG tablet Place 1 tablet into feeding tube daily.   Yes Historical Provider, MD  sodium chloride flush 0.9 % SOLN injection Inject 10 mLs into the vein every 6 (six) hours.   Yes Historical Provider, MD  thiamine 100 MG tablet Place 100 mg into feeding tube daily.   Yes Historical Provider, MD  traMADol (ULTRAM) 50 MG tablet Take 50 mg by mouth every 8 (eight) hours.   Yes Historical Provider, MD  Valproate Sodium (DEPAKENE) 250 MG/5ML SOLN solution Place 125 mg into feeding tube 2 (two) times daily.    Yes Historical Provider, MD   Valproate Sodium (DEPAKENE) 250 MG/5ML SOLN solution Place 500 mg into feeding tube at bedtime.   Yes Historical Provider, MD  vitamin C (ASCORBIC ACID) 500 MG tablet Place 500 mg into feeding tube daily.   Yes Historical Provider, MD  zinc sulfate 220 (50 Zn) MG capsule Place 220 mg into feeding tube daily.    Yes Historical Provider, MD    Family History History reviewed. No pertinent family history.  Social History Social History  Substance Use Topics  . Smoking status: Former Research scientist (life sciences)  . Smokeless tobacco: Never Used  . Alcohol use No     Comment: hx of alcohol abuse previously     Allergies   Patient has no known allergies.   Review of Systems Review of Systems  Unable to perform ROS: Intubated     Physical Exam Updated Vital Signs BP 110/70   Pulse 86   Temp 98.5 F (  36.9 C) (Axillary)   Resp 15   SpO2 92%   Physical Exam  Constitutional: He appears well-developed.  Appears chronically ill.  HENT:  Head: Normocephalic and atraumatic.  Mucus membranes dry.  Eyes: Conjunctivae are normal.  ptosis of right eyelid  Neck: Neck supple.  Cardiovascular: Normal rate and regular rhythm.   No murmur heard. Pulmonary/Chest: No respiratory distress.  Trach in place. Diminished breath sounds on left. Rhonchi on right.   Abdominal: Soft. He exhibits no distension. There is no tenderness.  Genitourinary:  Genitourinary Comments: Large stage IV sacral ulcer. Edges are c/d/i, no prurulent drainage, no induration. Foley in place, draining yellow urine.  Musculoskeletal: He exhibits no edema.  Neurological: He is alert.  Able to nod yes/no for questions and follow commands.   Skin: Skin is warm and dry.  Psychiatric: He has a normal mood and affect.  Nursing note and vitals reviewed.    ED Treatments / Results  Labs (all labs ordered are listed, but only abnormal results are displayed) Labs Reviewed  CBC WITH DIFFERENTIAL/PLATELET - Abnormal; Notable for the  following:       Result Value   WBC 19.2 (*)    RBC 3.42 (*)    Hemoglobin 9.7 (*)    HCT 30.9 (*)    Neutro Abs 18.0 (*)    Lymphs Abs 0.5 (*)    All other components within normal limits  COMPREHENSIVE METABOLIC PANEL - Abnormal; Notable for the following:    Sodium 133 (*)    Chloride 90 (*)    Glucose, Bld 143 (*)    BUN 39 (*)    Albumin 2.2 (*)    ALT 16 (*)    Alkaline Phosphatase 138 (*)    All other components within normal limits  URINALYSIS, ROUTINE W REFLEX MICROSCOPIC (NOT AT [facility]) - Abnormal; Notable for the following:    Color, Urine AMBER (*)    APPearance CLOUDY (*)    Hgb urine dipstick LARGE (*)    Ketones, ur 15 (*)    Protein, ur 100 (*)    Nitrite POSITIVE (*)    Leukocytes, UA LARGE (*)    All other components within normal limits  URINE MICROSCOPIC-ADD ON - Abnormal; Notable for the following:    Squamous Epithelial / LPF 0-5 (*)    Bacteria, UA MANY (*)    All other components within normal limits  BRAIN NATRIURETIC PEPTIDE - Abnormal; Notable for the following:    B Natriuretic Peptide 241.7 (*)    All other components within normal limits  TROPONIN I - Abnormal; Notable for the following:    Troponin I 0.16 (*)    All other components within normal limits  CBG MONITORING, ED - Abnormal; Notable for the following:    Glucose-Capillary 152 (*)    All other components within normal limits  CULTURE, RESPIRATORY (NON-EXPECTORATED)  CULTURE, BLOOD (ROUTINE X 2)  CULTURE, BLOOD (ROUTINE X 2)  URINE CULTURE  CULTURE, BAL-QUANTITATIVE  LACTIC ACID, PLASMA  LACTIC ACID, PLASMA  PROCALCITONIN  CBC  COMPREHENSIVE METABOLIC PANEL  TROPONIN I  TROPONIN I  I-STAT CG4 LACTIC ACID, ED  I-STAT TROPOININ, ED    EKG  EKG Interpretation  Date/Time:  Monday May 15 2016 15:41:45 EST Ventricular Rate:  86 PR Interval:    QRS Duration: 94 QT Interval:  368 QTC Calculation: 441 R Axis:   57 Text Interpretation:  Sinus rhythm Abnormal R-wave  progression, early transition No old tracing to  compare Confirmed by [facility]  MD-J, JON [phone]) on 05/15/2016 3:50:18 PM       Radiology Dg Chest Port 1 View  Result Date: 05/15/2016 CLINICAL DATA:  Unresponsive. Ventilated patient with tracheostomy tube. EXAM: PORTABLE CHEST 1 VIEW COMPARISON:  None. FINDINGS: Complete opacification of left hemithorax. There appears to be volume loss in left hemithorax suggesting left lung collapse and possibly related to mucous plugging. However, large left pleural effusion cannot be excluded. Diffuse airspace densities in the right lung may represent edema. PICC line tip near the superior cavoatrial junction. Tracheostomy tube is present. Heart is obscured by the left chest densities. IMPRESSION: Complete opacification of the left hemithorax probably related to collapse of the left lung from mucous plugging. Massive left pleural effusion cannot be excluded. Right lung airspace disease may represent pulmonary edema. Electronically Signed   By: Markus Daft M.D.   On: 05/15/2016 16:26    Procedures Procedures (including critical care time)  Medications Ordered in ED Medications  ceFEPIme (MAXIPIME) 2 g in dextrose 5 % 50 mL IVPB (not administered)  vancomycin (VANCOCIN) IVPB 1000 mg/200 mL premix (not administered)  famotidine (PEPCID) tablet 20 mg (not administered)  enoxaparin (LOVENOX) injection 40 mg (not administered)  lactulose (CHRONULAC) 10 GM/15ML solution 20 g (not administered)  polyethylene glycol (MIRALAX / GLYCOLAX) packet 17 g (not administered)  predniSONE (DELTASONE) tablet 5 mg (not administered)  thiamine (VITAMIN B-1) tablet 100 mg (not administered)  zinc sulfate capsule 220 mg (not administered)  vitamin C (ASCORBIC ACID) tablet 500 mg (not administered)  Valproate Sodium (DEPAKENE) solution 125 mg (not administered)  Valproate Sodium (DEPAKENE) solution 500 mg (not administered)  senna-docusate (Senokot-S) tablet 1 tablet (not  administered)  scopolamine (TRANSDERM-SCOP) 1 MG/3DAYS 1.5 mg (not administered)  mirtazapine (REMERON) tablet 15 mg (not administered)  loperamide (IMODIUM) capsule 2 mg (not administered)  folic acid (FOLVITE) tablet 1 mg (not administered)  clonazePAM (KLONOPIN) tablet 2 mg (not administered)  acetaminophen (TYLENOL) tablet 650 mg (not administered)  carvedilol (COREG) tablet 12.5 mg (not administered)  citalopram (CELEXA) tablet 10 mg (not administered)  dextrose 5 %-0.45 % sodium chloride infusion (not administered)  ondansetron (ZOFRAN) tablet 4 mg (not administered)    Or  ondansetron (ZOFRAN) injection 4 mg (not administered)  insulin aspart (novoLOG) injection 0-9 Units (2 Units Subcutaneous Given 05/15/16 2205)  chlorhexidine gluconate (MEDLINE KIT) (PERIDEX) 0.12 % solution 15 mL (not administered)  MEDLINE mouth rinse (not administered)  acetylcysteine (MUCOMYST) 20 % nebulizer / oral solution 3 mL (not administered)  albuterol (PROVENTIL) (2.5 MG/3ML) 0.083% nebulizer solution 2.5 mg (not administered)  sodium chloride 0.9 % bolus 1,000 mL (0 mLs Intravenous Stopped 05/15/16 1901)  ceFEPIme (MAXIPIME) 2 g in dextrose 5 % 50 mL IVPB (0 g Intravenous Stopped 05/15/16 1730)  sodium chloride 0.9 % bolus 1,000 mL (0 mLs Intravenous Stopped 05/15/16 1700)    And  sodium chloride 0.9 % bolus 1,000 mL (0 mLs Intravenous Stopped 05/15/16 2023)    And  sodium chloride 0.9 % bolus 500 mL (0 mLs Intravenous Stopped 05/15/16 2023)  vancomycin (VANCOCIN) 1,500 mg in sodium chloride 0.9 % 500 mL IVPB (0 mg Intravenous Stopped 05/15/16 2023)  fentaNYL (SUBLIMAZE) injection 50 mcg (50 mcg Intravenous Given 05/15/16 2215)  fentaNYL (SUBLIMAZE) injection 50 mcg (50 mcg Intravenous Given 05/15/16 2220)     Initial Impression / Assessment and Plan / ED Course  I have reviewed the triage vital signs and the nursing notes.  Pertinent labs &  imaging results that were available during my care  of the patient were reviewed by me and considered in my medical decision making (see chart for details).  Patient is a 34-year-old male with past medical history as above, chronic trach and vent dependent who presents after an episode of unresponsiveness. Concerning for syncope vs sepsis vs cardiac event.  Clinical Course    Afebrile. Hypotensive with systolics 16X over 09U on arrival. No tachycardia. Maintaining appropriate O2 sats on the normal vent settings. Patient is alert and following commands. Exam otherwise as above. EKG normal sinus rhythm without acute ischemic changes. As patient appears back to baseline, do not think he reports post-arrest care such as cooling. Plan to rule out infection, electrolyte abnormalities, dehydration.   30 mL/kg IV fluids given for hypotension. Labs significant for leukocytosis. Patient appears dehydrated as elevated BUN. Lactic acid within normal limits. Initial troponin within normal limits. CXR shows complete opacification of left hemithorax. Likely related to mucous plugging versus large pleural effusion vs underlying infection. There is also evidence of right lung airspace disease. Patient was started empirically on vancomycin/cefepime for coverage of possible healthcare associated pneumonia. UA also consistent with UTI though may be contaminated sample due to chronic Foley. Blood and urine cultures are pending.   Hypertension improved with fluid resuscitation. ICU consulted to evaluate patient. Recommend admission to stepdown unit for further monitoring and workup. Will continue to follow patient. Hospitalist consulted for admission of patient to the stepdown unit. Will be admitted to Dr. Fuller Plan for further management. BNP added to work up at request of admitting team.  Patient seen and discussed with Dr. Tomi Bamberger, ED attending    Final Clinical Impressions(s) / ED Diagnoses   Final diagnoses:  Pleural effusion on left  Syncope and collapse    Urinary tract infection without hematuria, site unspecified  Hypotension, unspecified hypotension type    New Prescriptions New Prescriptions   No medications on file     Gibson Ramp, MD 05/16/16 [phone]

## 2016-05-15 NOTE — Consult Note (Signed)
Name: Eric Sandoval MRN: [id number] DOB: 1956/02/20    ADMISSION DATE:  05/15/2016 CONSULTATION DATE:  05/15/2016  REFERRING MD :  Dr. Katrinka BlazingSmith [facility]  CHIEF COMPLAINT:  AMS  BRIEF PATIENT DESCRIPTION: 60 year old male with chronic trach/vent/PEG suffered brief cardiac arrest at [facility]. Responsive and following commands in ED. Complete opacification of L hemithorax on CXR. PCCM to consult for PNA and vent management.  SIGNIFICANT EVENTS    STUDIES:     HISTORY OF PRESENT ILLNESS:  60 year old male with PMH as below, which is significant for chronic trahc/peg with vent dependence (after prolonged admission for RLE amputation and then sepsis, MRSA PNA), PAF, COPD, DM, and tracheomalacia. He resides at [facility] where he is on vent 24/7 and has been deemed "not weanable", but why is unclear. He has also been on TF via PEG and has documented dysphagia. 11/20 at [facility], while being turned, he went unresponsive and was believed to be pulseless. It is unclear if he truly arrested, however, reports are that CPR went of anywhere from 1-15 mins prior to ROSC. Upon arrival to ED he was awake and following commands. He was hypotensive, which responded well to IVF. CXR demonstrated collapse of L lung. Also found to have UTI. Laboratory evaluation significant for WBC 19.2, hemoglobin 9.7, Plts 384, sodium 133, potassium 5.1, chloride 90, BUN 39, creatinine 0.73, calcium 9.2, glucose 143, lactic acid 1.74, and troponin 0.01. He was admitted to the hospitalist team for PNA and UTI. PCCM to consult for vent management.  PAST MEDICAL HISTORY :   has a past medical history of Alcohol abuse; Atrial fibrillation (HCC); COPD (chronic obstructive pulmonary disease) (HCC); Delirium; Diabetes mellitus without complication (HCC); DVT (deep venous thrombosis) (HCC); Dysphagia; History of eye enucleation; Metabolic encephalopathy; Peripheral vascular disease (HCC); Respiratory failure (HCC); SVT (supraventricular  tachycardia) (HCC); Tracheomalacia; and UTI (urinary tract infection).  has a past surgical history that includes Tracheostomy; PEG tube placement; and Below knee leg amputation (Right). Prior to Admission medications   Medication Sig Start Date End Date Taking? Authorizing Provider  acetaminophen (TYLENOL) 325 MG tablet Place 650 mg into feeding tube every 6 (six) hours as needed for moderate pain.    Yes Historical Provider, MD  albuterol (ACCUNEB) 1.25 MG/3ML nebulizer solution Take 1 ampule by nebulization every 4 (four) hours as needed for wheezing or shortness of breath.   Yes Historical Provider, MD  carvedilol (COREG) 12.5 MG tablet Place 12.5 mg into feeding tube 2 (two) times daily with a meal.   Yes Historical Provider, MD  chlorhexidine (PERIDEX) 0.12 % solution Use as directed 15 mLs in the mouth or throat 3 (three) times daily. For trach tube   Yes Historical Provider, MD  citalopram (CELEXA) 10 MG tablet Place 10 mg into feeding tube daily.   Yes Historical Provider, MD  clonazePAM (KLONOPIN) 2 MG tablet Place 2 mg into feeding tube 3 (three) times daily as needed for anxiety.   Yes Historical Provider, MD  enoxaparin (LOVENOX) 40 MG/0.4ML injection Inject 40 mg into the skin daily.   Yes Historical Provider, MD  famotidine (PEPCID) 20 MG tablet Place 20 mg into feeding tube 2 (two) times daily.    Yes Historical Provider, MD  folic acid (FOLVITE) 1 MG tablet Place 1 mg into feeding tube daily.   Yes Historical Provider, MD  insulin aspart (NOVOLOG) 100 UNIT/ML injection Inject 14 Units into the skin 2 (two) times daily.   Yes Historical Provider, MD  insulin  regular (NOVOLIN R,HUMULIN R) 100 units/mL injection Inject 0-10 Units into the skin 3 (three) times daily before meals. sliding scale   Yes Historical Provider, MD  lactulose (CHRONULAC) 10 GM/15ML solution Place 20 g into feeding tube daily as needed for moderate constipation.   Yes Historical Provider, MD  loperamide (IMODIUM)  2 MG capsule Take 2 mg by mouth every 6 (six) hours as needed for diarrhea or loose stools.   Yes Historical Provider, MD  mirtazapine (REMERON) 7.5 MG tablet Place 7.5 mg into feeding tube at bedtime.   Yes Historical Provider, MD  Multiple Vitamin (MULTIVITAMIN WITH MINERALS) TABS tablet Place 1 tablet into feeding tube daily.   Yes Historical Provider, MD  ondansetron (ZOFRAN) 4 MG/2ML SOLN injection Inject 4 mg into the vein every 6 (six) hours as needed for nausea or vomiting.    Yes Historical Provider, MD  polyethylene glycol (MIRALAX / GLYCOLAX) packet Place 17 g into feeding tube daily as needed for moderate constipation (hold for diarrhea).   Yes Historical Provider, MD  predniSONE (DELTASONE) 5 MG tablet Place 15 mg into feeding tube daily with breakfast.    Yes Historical Provider, MD  Protein POWD Give 1 scoop by tube every 6 (six) hours.   Yes Historical Provider, MD  scopolamine (TRANSDERM-SCOP, 1.5 MG,) 1 MG/3DAYS Place 1 patch onto the skin every 3 (three) days. For increased secretions   Yes Historical Provider, MD  senna-docusate (SENOKOT-S) 8.6-50 MG tablet Place 1 tablet into feeding tube daily.   Yes Historical Provider, MD  sodium chloride flush 0.9 % SOLN injection Inject 10 mLs into the vein every 6 (six) hours.   Yes Historical Provider, MD  thiamine 100 MG tablet Place 100 mg into feeding tube daily.   Yes Historical Provider, MD  traMADol (ULTRAM) 50 MG tablet Take 50 mg by mouth every 8 (eight) hours.   Yes Historical Provider, MD  Valproate Sodium (DEPAKENE) 250 MG/5ML SOLN solution Place 125 mg into feeding tube 2 (two) times daily.    Yes Historical Provider, MD  Valproate Sodium (DEPAKENE) 250 MG/5ML SOLN solution Place 500 mg into feeding tube at bedtime.   Yes Historical Provider, MD  vitamin C (ASCORBIC ACID) 500 MG tablet Place 500 mg into feeding tube daily.   Yes Historical Provider, MD  zinc sulfate 220 (50 Zn) MG capsule Place 220 mg into feeding tube daily.     Yes Historical Provider, MD   No Known Allergies  FAMILY HISTORY:  family history is not on file. SOCIAL HISTORY:  reports that he has quit smoking. He has never used smokeless tobacco. He reports that he does not drink alcohol.  REVIEW OF SYSTEMS:   Bolds are positive  Constitutional: weight loss, gain, night sweats, Fevers, chills, fatigue .  HEENT: headaches, Sore throat, sneezing, nasal congestion, post nasal drip, Difficulty swallowing, Tooth/dental problems, visual complaints visual changes, ear ache CV:  chest pain, radiates: ,Orthopnea, PND, swelling in lower extremities, dizziness, palpitations, syncope.  GI  heartburn, indigestion, abdominal pain, nausea, vomiting, diarrhea, change in bowel habits, loss of appetite, bloody stools.  Resp: cough, productive: , hemoptysis, dyspnea, chest pain, pleuritic.  Skin: rash or itching or icterus GU: dysuria, change in color of urine, urgency or frequency. flank pain, hematuria  MS: joint pain or swelling. decreased range of motion  Psych: change in mood or affect. depression or anxiety.  Neuro: difficulty with speech, weakness, numbness, ataxia    SUBJECTIVE:   VITAL SIGNS: Temp:  [98.5 F (  36.9 C)] 98.5 F (36.9 C) (11/20 1539) Pulse Rate:  [80-93] 87 (11/20 2100) Resp:  [15-23] 23 (11/20 2100) BP: (81-118)/(54-104) 118/55 (11/20 2100) SpO2:  [88 %-100 %] 96 % (11/20 2100) FiO2 (%):  [30 %-50 %] 50 % (11/20 1924)  PHYSICAL EXAMINATION: General:  Chronically ill appearing male on vent via trach Neuro:  Alert, follows commands, nods yes/no appropriately HEENT:  [place]/AT, L pupil reactive to light, R eye post-surgical change, no pupil. Trach site clean dry. #7 cuffed Cardiovascular:  RRR, no MRG Lungs:  Coarse rhonchi throughout L>R. diminished over LLL Abdomen:  Soft, non-tender, non-distended. PEG site clean/dry Musculoskeletal:  R BKA, no edema Skin:  Grossly intact   Recent Labs Lab 05/15/16 1608  NA 133*  K 5.1  CL  90*  CO2 30  BUN 39*  CREATININE 0.73  GLUCOSE 143*    Recent Labs Lab 05/15/16 1608  HGB 9.7*  HCT 30.9*  WBC 19.2*  PLT 384   Dg Chest Port 1 View  Result Date: 05/15/2016 CLINICAL DATA:  Unresponsive. Ventilated patient with tracheostomy tube. EXAM: PORTABLE CHEST 1 VIEW COMPARISON:  None. FINDINGS: Complete opacification of left hemithorax. There appears to be volume loss in left hemithorax suggesting left lung collapse and possibly related to mucous plugging. However, large left pleural effusion cannot be excluded. Diffuse airspace densities in the right lung may represent edema. PICC line tip near the superior cavoatrial junction. Tracheostomy tube is present. Heart is obscured by the left chest densities. IMPRESSION: Complete opacification of the left hemithorax probably related to collapse of the left lung from mucous plugging. Massive left pleural effusion cannot be excluded. Right lung airspace disease may represent pulmonary edema. Electronically Signed   By: Richarda Overlie M.D.   On: 05/15/2016 16:26    ASSESSMENT / PLAN:  Acute on chronic hypoxemic respiratory failure secondary to mucous plugging in setting of likely HCAP: He has apparently struggled with plugs and thick secretions many times in the past, this is likely what limited weaning. Suspect resp/cardiac arrest is due to this as well.  - Vent to [facility], otherwise maintain [facility] settings.  - VAP bundle - For FOB tonight by Dr. Vassie Loll - Broad ABX per primary for HCAP coverage - Sputum culture in addition to blood, urine - Mucomyst nebs - Chest PT - Scheduled xopenex, ipratropium  - Consider SBT once acute illness resolved  Severe Sepsis: BP responded to IVF. Lactic acid WNL, good UOP, and seems to be mentating at baseline.  - IVF hydration, ensure 30cc/kg, can probably handle more than that if needed - No need to repeat lactic acid - Reasonable to move to ICU for pressors if MAP remains low and he clinically  changes - He is also on daily prednisone, consider relative AI and stress dose steroids.  UTI, DM: - Per priamry  Joneen Roach, AGACNP-BC [facility] Pulmonology/Critical Care Pager [phone] or [phone]  05/15/2016 10:35 PM

## 2016-05-15 NOTE — Progress Notes (Signed)
Pharmacy Antibiotic Note  Eric Sandoval is a 60 y.o. male admitted on 05/15/2016 with pneumonia.  Pharmacy has been consulted for vancomycin and cefepime dosing.  Day #1 of abx for possible PNA. Afebrile, WBC elevated at 19.2. Weight estimated at 85 kg. SCr stable, CrCl ~6480ml/min  Plan: Give cefepime 2g IV x 1, then start cefepime 2g IV Q8 Give vancomycin 1.5g IV x 1, then start vancomycin 1g IV Q12 Monitor clinical picture, renal function, VT prn F/U C&S, abx deescalation / LOT   Temp (24hrs), Avg:98.5 F (36.9 C), Min:98.5 F (36.9 C), Max:98.5 F (36.9 C)   Recent Labs Lab 05/15/16 1608 05/15/16 1612  WBC 19.2*  --   LATICACIDVEN  --  1.74    CrCl cannot be calculated (Patient's most recent lab result is older than the maximum 21 days allowed.).    No Known Allergies  Antimicrobials this admission:  Cefepime 11/20 >>  Vancomycin 11/20 >>   Dose adjustments this admission:  N/a  Microbiology results:  11/20 BCx: sent 11/20 UCx: sent   Thank you for allowing pharmacy to be a part of this patient's care.  Armandina StammerBATCHELDER,Habiba Treloar J 05/15/2016 4:33 PM

## 2016-05-15 NOTE — ED Triage Notes (Signed)
Per EMS - pt from [facility]. Pt is vent dependent pt w/ trach, PEG tube, urinary catheter. During wound care, pt was rolled on side, stopped moving arms and became unresponsive per facility. Facility performed 1 min CPR and gave 1 epi, palpated pulse after. Last BP w/ [facility] 112/76. EMS initial SBP 80palp. Given 200cc through PICC line. Last BP w/ EMS 120/80. Hx Afib, SVT, right BKA. 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

## 2016-05-16 ENCOUNTER — Inpatient Hospital Stay (HOSPITAL_COMMUNITY): Payer: Medicare Other

## 2016-05-16 ENCOUNTER — Encounter (HOSPITAL_COMMUNITY): Payer: Self-pay

## 2016-05-16 LAB — COMPREHENSIVE METABOLIC PANEL
ALT: 16 U/L — ABNORMAL LOW (ref 17–63)
ANION GAP: 10 (ref 5–15)
AST: 19 U/L (ref 15–41)
Albumin: 2.1 g/dL — ABNORMAL LOW (ref 3.5–5.0)
Alkaline Phosphatase: 130 U/L — ABNORMAL HIGH (ref 38–126)
BILIRUBIN TOTAL: 0.6 mg/dL (ref 0.3–1.2)
BUN: 26 mg/dL — ABNORMAL HIGH (ref 6–20)
CHLORIDE: 97 mmol/L — AB (ref 101–111)
CO2: 28 mmol/L (ref 22–32)
Calcium: 8.9 mg/dL (ref 8.9–10.3)
Creatinine, Ser: 0.61 mg/dL (ref 0.61–1.24)
GFR calc Af Amer: >60 mL/min (ref >60)
Glucose, Bld: 147 mg/dL — ABNORMAL HIGH (ref 65–99)
POTASSIUM: 4.3 mmol/L (ref 3.5–5.1)
Sodium: 135 mmol/L (ref 135–145)
TOTAL PROTEIN: 6.8 g/dL (ref 6.5–8.1)

## 2016-05-16 LAB — CBC
HEMATOCRIT: 28 % — AB (ref 39.0–52.0)
Hemoglobin: 8.9 g/dL — ABNORMAL LOW (ref 13.0–17.0)
MCH: 28.5 pg (ref 26.0–34.0)
MCHC: 31.8 g/dL (ref 30.0–36.0)
MCV: 89.7 fL (ref 78.0–100.0)
PLATELETS: 340 10*3/uL (ref 150–400)
RBC: 3.12 MIL/uL — ABNORMAL LOW (ref 4.22–5.81)
RDW: 15.4 % (ref 11.5–15.5)
WBC: 11.7 10*3/uL — ABNORMAL HIGH (ref 4.0–10.5)

## 2016-05-16 LAB — MRSA PCR SCREENING: MRSA BY PCR: POSITIVE — AB

## 2016-05-16 LAB — TROPONIN I
Troponin I: 0.24 ng/mL (ref <0.03)
Troponin I: 0.18 ng/mL (ref <0.03)

## 2016-05-16 LAB — CBG MONITORING, ED
GLUCOSE-CAPILLARY: 149 mg/dL — AB (ref 65–99)
GLUCOSE-CAPILLARY: 206 mg/dL — AB (ref 65–99)

## 2016-05-16 LAB — GLUCOSE, CAPILLARY: GLUCOSE-CAPILLARY: 198 mg/dL — AB (ref 65–99)

## 2016-05-16 MED ORDER — MUPIROCIN 2 % EX OINT
1.0000 "application " | TOPICAL_OINTMENT | Freq: Two times a day (BID) | CUTANEOUS | Status: DC
  Administered 2016-05-16 – 2016-05-19 (×6): 1 via NASAL
  Filled 2016-05-16 (×3): qty 22

## 2016-05-16 MED ORDER — CHLORHEXIDINE GLUCONATE CLOTH 2 % EX PADS
6.0000 | MEDICATED_PAD | Freq: Every day | CUTANEOUS | Status: DC
  Administered 2016-05-17 – 2016-05-19 (×3): 6 via TOPICAL

## 2016-05-16 MED ORDER — SENNOSIDES 8.8 MG/5ML PO SYRP
5.0000 mL | ORAL_SOLUTION | Freq: Every day | ORAL | Status: DC
  Administered 2016-05-16: 5 mL
  Filled 2016-05-16: qty 5

## 2016-05-16 NOTE — ED Notes (Signed)
Patient transported to CT 

## 2016-05-16 NOTE — ED Notes (Signed)
Pt moved into hospital bed, wet to dry dressing placed in decubitus on coccyx. Pt moved into B16 --

## 2016-05-16 NOTE — ED Notes (Signed)
Attempted report x1. To call Tonya [phone].

## 2016-05-16 NOTE — Progress Notes (Signed)
MEDICATION RELATED NOTE   Pharmacy Re:  Home meds  Assessment: Medication list contains medications patient was on at [facility].  We were unable to identify last doses, or if patient was on home meds prior to his admission to [facility].  Plan:  I have marked his record complete - maintaining the medication list from [facility].  Please resume those medications you feel most appropriate as he transitions through care.  Nadara MustardNita Shalin Vonbargen, PharmD., MS Clinical Pharmacist Pager:  [phone] Thank you for allowing pharmacy to be part of this patients care team. 05/16/2016,8:45 AM

## 2016-05-16 NOTE — Progress Notes (Signed)
Patient ID: Eric Sandoval, male   DOB: 07-Sep-1955, 60 y.o.   MRN: [id number]    PROGRESS NOTE    Eric Sandoval  GUY:[id number] DOB: Jun 16, 1956 DOA: 05/15/2016  PCP: Pt from [facility]   Brief Narrative:   60 y.o. male with medical history significant of chronic respiratory failure, s/p tracheostomy, A. fib with  SVT, dysphagiam, s/p PEG, and diabetes mellitus type 2; who presented after having an episode of unresponsiveness. Patient has been residing at [place] and was noted to be unresponsive without pulse at approximately 2:25 PM on the day of the admission. CPR was initiated and patient was given 1 dose of epinephrine around 2:28pm.  The patient was noted to regain consciousness and return of spontaneous circulation at approximately 2:29 pm. Upon EMS arrival patient was noted to be hypotensive, but was able to follow commands and mouth words.  ED Course: Upon admission into the emergency department patient was seen to be afebrile, pulse 81-91, blood pressure as low as 81/71, and O2 sats 88 -100%.  Lab work revealed WBC 19.2, hemoglobin 9.7, Plts 384, sodium 133, potassium 5.1, chloride 90, BUN 39, creatinine 0.73, calcium 9.2, glucose 143, lactic acid 1.74, and trop 0.01. Urinalysis many bacteria, large hemoglobin, large leukocyte esterase, positive nitrites, 0-5 squamous epithelial cells, TNTC WBCs. Chest x-ray shows complete opacification of the left hemithorax related to possibility of mucous plugging. PCCM was consulted for vent management. Sepsis protocol initiated and patient was started on vancomycin and cefepime.  Assessment & Plan:   Assessment/Plan Sepsis 2/2 suspectedComplicated UTI /HCAP, unknown pathogen  - WBC elevated at 19.2 with a positive urinalysis and abnormal chest x-ray.  - Patient was given full dose fluid bolus per sepsis protocol and started on vancomycin and cefepime - admitted to SDU and needs to stay in SDU today - continue Vancomycin and Maxipime day  #2 - Follow up blood, urine, sputum culture - Will need to change out Foley at some point  Acute on Chronic respiratory failure with hypoxia and hypercapnia s/p tracheostomy  - Patient chronically vent dependent - Continue scopolamine patch, prednisone, albuterol breathing treatment - consulted PCCM for vent management  - pt underwent bronch yesterday by Dr. Elsworth Soho, purulent secretion removed from left main bronchus    Unresponsiveness, elevated troponins - question cardiac and/or respiratory arrest:  - Resolved. Patient apparently had reportedly lost pulse and consciousness - monitor in SDU - troponins trending down  Dysphagia S/p PEG - Continue tube feedings per [facility]   Dehydration - Patient presents with elevated BUN to creatinine ratio.  - Patient also found to have low chloride and sodium levels. 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 Secure with tape. Change daily and as needed - appreciate assistance   Anemia: Chronic.  - Hemoglobin on 9.7 which appears near patient's baseline per review of records from [facility] where patient's Hg was ~10 on 11/10 - Continue to monitor  GERD with no esophagitis  - continue  Pepcid  DVT prophylaxis: Lovenox SQ Code Status: Full  Family Communication: No family at bedside  Disposition Plan: Back to [facility]   Consultants:   PCCM for vent management   Procedures:    Bronchoscopy - Diagnostic evaluation of the airways and Remove secretions, done by Dr. Elsworth Soho 05/15/2016 - BAL performed. Thick inspissated purulent secretions in the left mainstem bronchus were suctioned out over 5-10 minutes with lavage.  Antimicrobials:   Vancomycin 11/20 -->  Cefepime 11/20 -->  Subjective: No events overnight.   Objective: Vitals:   05/16/16 1400 05/16/16 1457 05/16/16 1500 05/16/16 1543  BP: 98/61   (!) 107/59  Pulse: 87  89 90  Resp: 16  15 17   Temp:    98.1 F (36.7 C)  TempSrc:    Oral  SpO2: 93% 95% 95% 98%  Weight:    77.4 kg (170 lb 10.2 oz)  Height:    5' 10"  (1.778 m)    Intake/Output Summary (Last 24 hours) at 05/16/16 1552 Last data filed at 05/16/16 0421  Gross per 24 hour  Intake             2050 ml  Output             1100 ml  Net              950 ml   Filed Weights   05/16/16 1543  Weight: 77.4 kg (170 lb 10.2 oz)    Examination:  General exam: Appears calm,NAD Respiratory system:  Respiratory effort normal. Diminished breath sounds at bases, trach in place  Cardiovascular system: S1 & S2 heard, RRR. No JVD, rubs, gallops or clicks. No pedal edema. Gastrointestinal system: Abdomen is nondistended, soft and nontender. No organomegaly or masses felt. Normal bowel sounds heard.  Data Reviewed: I have personally reviewed following labs and imaging studies  CBC:  Recent Labs Lab 05/15/16 1608 05/16/16 0335  WBC 19.2* 11.7*  NEUTROABS 18.0*  --   HGB 9.7* 8.9*  HCT 30.9* 28.0*  MCV 90.4 89.7  PLT 384 119   Basic Metabolic Panel:  Recent Labs Lab 05/15/16 1608 05/16/16 0335  NA 133* 135  K 5.1 4.3  CL 90* 97*  CO2 30 28  GLUCOSE 143* 147*  BUN 39* 26*  CREATININE 0.73 0.61  CALCIUM 9.2 8.9   Liver Function Tests:  Recent Labs Lab 05/15/16 1608 05/16/16 0335  AST 24 19  ALT 16* 16*  ALKPHOS 138* 130*  BILITOT 0.4 0.6  PROT 7.4 6.8  ALBUMIN 2.2* 2.1*   Cardiac Enzymes:  Recent Labs Lab  05/15/16 2109 05/16/16 0335 05/16/16 0845  TROPONINI 0.16* 0.24* 0.18*   CBG:  Recent Labs Lab 05/15/16 2119 05/16/16 0309 05/16/16 1007  GLUCAP 152* 149* 206*   Urine analysis:    Component Value Date/Time   COLORURINE AMBER (A) 05/15/2016 1700   APPEARANCEUR CLOUDY (A) 05/15/2016 1700   LABSPEC 1.023 05/15/2016 1700   PHURINE 5.5 05/15/2016 1700   GLUCOSEU NEGATIVE 05/15/2016 1700   HGBUR LARGE (A) 05/15/2016 1700   BILIRUBINUR NEGATIVE 05/15/2016 1700   KETONESUR 15 (A) 05/15/2016 1700   PROTEINUR 100 (A) 05/15/2016 1700   NITRITE POSITIVE (A) 05/15/2016 1700   LEUKOCYTESUR LARGE (A) 05/15/2016 1700   Recent Results (from the past 240 hour(s))  Blood culture (routine x 2)     Status: None (Preliminary result)   Collection Time: 05/15/16  3:50 PM  Result Value Ref Range Status   Specimen Description BLOOD LEFT  UPPER ARM  Final   Special Requests IN PEDIATRIC BOTTLE 1CC  Final   Culture NO GROWTH < 24 HOURS  Final   Report Status PENDING  Incomplete  Blood culture (routine x 2)     Status: None (Preliminary result)   Collection Time: 05/15/16  4:35 PM  Result Value Ref Range Status   Specimen Description BLOOD LEFT HAND  Final   Special Requests BOTTLES DRAWN AEROBIC AND ANAEROBIC 5CC  Final   Culture NO GROWTH < 24 HOURS  Final   Report Status PENDING  Incomplete  Culture, respiratory (NON-Expectorated)     Status: None (Preliminary result)   Collection Time: 05/15/16 10:40 PM  Result Value Ref Range Status   Specimen Description BRONCHIAL ALVEOLAR LAVAGE  Final   Special Requests NONE  Final   Gram Stain   Final    ABUNDANT WBC PRESENT,BOTH PMN AND MONONUCLEAR MODERATE GRAM VARIABLE ROD FEW GRAM POSITIVE COCCI IN PAIRS IN CLUSTERS    Culture TOO YOUNG TO READ  Final   Report Status PENDING  Incomplete      Radiology Studies: Ct Head Wo Contrast  Result Date: 05/16/2016 CLINICAL DATA:  Unresponsive patient.  Status post CPR. EXAM: CT HEAD WITHOUT  CONTRAST TECHNIQUE: Contiguous axial images were obtained from the base of the skull through the vertex without intravenous contrast. COMPARISON:  None. FINDINGS: Brain: There is no midline shift or mass effect. There is no extra-axial collection. No intraparenchymal or extra-axial hemorrhage. Incidentally noted is a cavum septum pellucidum et vergae. There is no CT evidence of acute cortical infarct. The basal ganglia and insular cortices are preserved. No hydrocephalus. There is periventricular hypoattenuation compatible with chronic microvascular disease. Vascular: Atherosclerotic calcification of the internal carotid arteries is present at the skullbase. Skull: No skull fracture or focal calvarial lesion. Visualized skull base is normal. Sinuses/Orbits: Fluid level in the right sphenoid sinus. Otherwise clear paranasal sinuses and mastoids. 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 The left orbit is normal. Other: None IMPRESSION: 1. No acute intracranial abnormality. 2. Findings of chronic microvascular ischemia. Electronically Signed   By: Ulyses Jarred M.D.   On: 05/16/2016 06:46   Dg Chest Port 1 View  Result Date: 05/15/2016 CLINICAL DATA:  Unresponsive. Ventilated patient with tracheostomy tube. EXAM: PORTABLE CHEST 1 VIEW COMPARISON:  None. FINDINGS: Complete opacification of left hemithorax. There appears to be volume loss in left hemithorax suggesting left lung collapse and possibly related to mucous plugging. However, large left pleural effusion cannot be excluded. Diffuse airspace densities in the right lung may represent edema. PICC line tip near the superior cavoatrial junction. Tracheostomy tube is present. Heart is obscured by the left chest densities. IMPRESSION: Complete opacification of the left hemithorax probably related to collapse of the left lung from mucous plugging. Massive left pleural effusion cannot be excluded. Right lung airspace disease may represent pulmonary edema. Electronically  Signed   By: Markus Daft M.D.   On: 05/15/2016 16:26      Scheduled Meds: . acetylcysteine  3 mL Nebulization BID  . albuterol  2.5 mg Nebulization Q6H  . carvedilol  12.5 mg Per Tube BID WC  . ceFEPime (MAXIPIME) IV  2 g Intravenous Q8H  . chlorhexidine gluconate (MEDLINE KIT)  15 mL Mouth Rinse BID  . citalopram  10 mg Per Tube Daily  . enoxaparin  40 mg Subcutaneous Q24H  . famotidine  20 mg Oral BID  . folic acid  1 mg Per Tube Daily  .  insulin aspart  0-9 Units Subcutaneous Q6H  . mouth rinse  15 mL Mouth Rinse QID  . mirtazapine  15 mg Per Tube QHS  . predniSONE  5 mg Per Tube Q breakfast  . scopolamine  1 patch Transdermal Q72H  . sennosides  5 mL Per Tube QHS  . thiamine  100 mg Per Tube Daily  . Valproate Sodium  125 mg Oral BID  . Valproate Sodium  500 mg Per Tube QHS  . vancomycin  1,000 mg Intravenous Q12H  . vitamin C  500 mg Per Tube Daily  . zinc sulfate  220 mg Oral Daily   Continuous Infusions: . dextrose 5 % and 0.45% NaCl 75 mL/hr at 05/16/16 0323     LOS: 1 day   Time spent: 20 minutes   Faye Ramsay, MD [facility] Pager [phone]  If 7PM-7AM, please contact night-coverage [email or web address] Password [email or web address] 05/16/2016, 3:52 PM

## 2016-05-16 NOTE — ED Notes (Signed)
Attempted to call report to [phone]

## 2016-05-16 NOTE — Consult Note (Signed)
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 Secure with tape. Change daily and as needed  ED staff obtained hospital bed for patient. WOC nurse will follow if admitted.   Durel SaltsKeatah Brooks FNP-C, [facility] student  Discussed POC with patient and bedside nurse.  Re consult if needed, will not follow at this time. Thanks  Laqueisha Catalina M.[employer] MSN, RN,CWOCN, CNS [phone])

## 2016-05-16 NOTE — ED Notes (Signed)
Vanc compatible with D5

## 2016-05-16 NOTE — Progress Notes (Signed)
Patient transported on vent to CT and back to ED TRA -A. No complications noted.

## 2016-05-16 NOTE — Progress Notes (Signed)
Pt moved from trauma A to B16 with nursing staff no events.

## 2016-05-16 NOTE — ED Notes (Signed)
Attempted to give report to floor, Emer, Charity fundraiserN. Unsuccessful.

## 2016-05-16 NOTE — ED Notes (Signed)
Pt continues to pull vent off trach-- mittens placed on both hands .

## 2016-05-16 NOTE — Progress Notes (Signed)
Pt continues to disconnect himself from the vent, when explaining he cannot keep doing this he asks me to "disconnect him". Pt already has mittens on.

## 2016-05-16 NOTE — Progress Notes (Signed)
Pt from [facility]. Has trach-vent-peg. Have call into [facility] rep to see if vent snf or ltac. Made sw ref in case needed. Cont to follow.

## 2016-05-16 NOTE — ED Notes (Signed)
Pt CBG was 206, notified Emergency planning/management officertacy (RN)

## 2016-05-16 NOTE — ED Notes (Signed)
Attempted to give report to 4N. Unsuccessful.

## 2016-05-17 ENCOUNTER — Inpatient Hospital Stay (HOSPITAL_COMMUNITY): Payer: Medicare Other

## 2016-05-17 ENCOUNTER — Other Ambulatory Visit (HOSPITAL_COMMUNITY): Payer: Medicare Other

## 2016-05-17 DIAGNOSIS — J9612 Chronic respiratory failure with hypercapnia: Secondary | ICD-10-CM

## 2016-05-17 DIAGNOSIS — L89159 Pressure ulcer of sacral region, unspecified stage: Secondary | ICD-10-CM

## 2016-05-17 DIAGNOSIS — D649 Anemia, unspecified: Secondary | ICD-10-CM

## 2016-05-17 DIAGNOSIS — A4151 Sepsis due to Escherichia coli [E. coli]: Secondary | ICD-10-CM

## 2016-05-17 DIAGNOSIS — J9611 Chronic respiratory failure with hypoxia: Secondary | ICD-10-CM

## 2016-05-17 DIAGNOSIS — A419 Sepsis, unspecified organism: Principal | ICD-10-CM

## 2016-05-17 DIAGNOSIS — N390 Urinary tract infection, site not specified: Secondary | ICD-10-CM

## 2016-05-17 LAB — BASIC METABOLIC PANEL
ANION GAP: 9 (ref 5–15)
BUN: 11 mg/dL (ref 6–20)
CALCIUM: 8.9 mg/dL (ref 8.9–10.3)
CO2: 29 mmol/L (ref 22–32)
Chloride: 98 mmol/L — ABNORMAL LOW (ref 101–111)
Creatinine, Ser: 0.58 mg/dL — ABNORMAL LOW (ref 0.61–1.24)
GFR calc Af Amer: >60 mL/min (ref >60)
GLUCOSE: 155 mg/dL — AB (ref 65–99)
Potassium: 3.8 mmol/L (ref 3.5–5.1)
SODIUM: 136 mmol/L (ref 135–145)

## 2016-05-17 LAB — GLUCOSE, CAPILLARY
GLUCOSE-CAPILLARY: 165 mg/dL — AB (ref 65–99)
Glucose-Capillary: 205 mg/dL — ABNORMAL HIGH (ref 65–99)
Glucose-Capillary: 140 mg/dL — ABNORMAL HIGH (ref 65–99)
Glucose-Capillary: 139 mg/dL — ABNORMAL HIGH (ref 65–99)
Glucose-Capillary: 163 mg/dL — ABNORMAL HIGH (ref 65–99)

## 2016-05-17 LAB — CBC
HCT: 29.9 % — ABNORMAL LOW (ref 39.0–52.0)
Hemoglobin: 9.1 g/dL — ABNORMAL LOW (ref 13.0–17.0)
MCH: 27.6 pg (ref 26.0–34.0)
MCHC: 30.4 g/dL (ref 30.0–36.0)
MCV: 90.6 fL (ref 78.0–100.0)
PLATELETS: 398 10*3/uL (ref 150–400)
RBC: 3.3 MIL/uL — ABNORMAL LOW (ref 4.22–5.81)
RDW: 15.6 % — AB (ref 11.5–15.5)
WBC: 11 10*3/uL — AB (ref 4.0–10.5)

## 2016-05-17 LAB — PHOSPHORUS: Phosphorus: 4.1 mg/dL (ref 2.5–4.6)

## 2016-05-17 LAB — MAGNESIUM: MAGNESIUM: 2.1 mg/dL (ref 1.7–2.4)

## 2016-05-17 MED ORDER — ACETYLCYSTEINE 20 % IN SOLN
4.0000 mL | Freq: Two times a day (BID) | RESPIRATORY_TRACT | Status: DC
  Administered 2016-05-17 – 2016-05-18 (×2): 4 mL via RESPIRATORY_TRACT
  Filled 2016-05-17 (×2): qty 4

## 2016-05-17 MED ORDER — FUROSEMIDE 10 MG/ML IJ SOLN
40.0000 mg | Freq: Two times a day (BID) | INTRAMUSCULAR | Status: AC
  Administered 2016-05-17 – 2016-05-18 (×4): 40 mg via INTRAVENOUS
  Filled 2016-05-17 (×4): qty 4

## 2016-05-17 MED ORDER — VITAL AF 1.2 CAL PO LIQD
1000.0000 mL | ORAL | Status: DC
  Administered 2016-05-17 – 2016-05-18 (×2): 1000 mL
  Filled 2016-05-17 (×6): qty 1000

## 2016-05-17 MED ORDER — INSULIN ASPART 100 UNIT/ML ~~LOC~~ SOLN: 0.0000 [IU] | Freq: Four times a day (QID) | SUBCUTANEOUS | Status: DC

## 2016-05-17 MED ORDER — INSULIN ASPART 100 UNIT/ML ~~LOC~~ SOLN
0.0000 [IU] | SUBCUTANEOUS | Status: DC
  Administered 2016-05-17: 2 [IU] via SUBCUTANEOUS
  Administered 2016-05-18 (×2): 5 [IU] via SUBCUTANEOUS
  Administered 2016-05-18 (×3): 3 [IU] via SUBCUTANEOUS
  Administered 2016-05-19: 5 [IU] via SUBCUTANEOUS
  Administered 2016-05-19: 2 [IU] via SUBCUTANEOUS
  Administered 2016-05-19 (×2): 3 [IU] via SUBCUTANEOUS
  Administered 2016-05-19: 5 [IU] via SUBCUTANEOUS

## 2016-05-17 MED ORDER — PRO-STAT SUGAR FREE PO LIQD
30.0000 mL | Freq: Every day | ORAL | Status: DC
  Administered 2016-05-17 – 2016-05-19 (×3): 30 mL
  Filled 2016-05-17 (×2): qty 30

## 2016-05-17 NOTE — Consult Note (Signed)
WOC follow up with patient after admitted to 4N. Sport LALM in place while in ICU.  Mattress replacement order in computer should patient move out of the ICU to stepdown unit.   Continue wound care as ordered.  Margeaux Swantek [facility] MSN, RN,CWOCN, CNS [phone]

## 2016-05-17 NOTE — Consult Note (Signed)
Name: Eric Sandoval MRN: [id number] DOB: 20-Oct-1955    ADMISSION DATE:  05/15/2016 CONSULTATION DATE:  05/15/2016  REFERRING MD :  Dr. Katrinka BlazingSmith [facility]  CHIEF COMPLAINT:  AMS  BRIEF PATIENT DESCRIPTION: 60 year old male with chronic trach/vent/PEG suffered brief cardiac arrest at [facility]. Responsive and following commands in ED. Complete opacification of L hemithorax on CXR. PCCM to consult for PNA and vent management.  SIGNIFICANT EVENTS    STUDIES:     HISTORY OF PRESENT ILLNESS:  60 year old male with PMH as below, which is significant for chronic trahc/peg with vent dependence (after prolonged admission for RLE amputation and then sepsis, MRSA PNA), PAF, COPD, DM, and tracheomalacia. He resides at [facility] where he is on vent 24/7 and has been deemed "not weanable", but why is unclear. He has also been on TF via PEG and has documented dysphagia. 11/20 at [facility], while being turned, he went unresponsive and was believed to be pulseless. It is unclear if he truly arrested, however, reports are that CPR went of anywhere from 1-15 mins prior to ROSC. Upon arrival to ED he was awake and following commands. He was hypotensive, which responded well to IVF. CXR demonstrated collapse of L lung. Also found to have UTI. Laboratory evaluation significant for WBC 19.2, hemoglobin 9.7, Plts 384, sodium 133, potassium 5.1, chloride 90, BUN 39, creatinine 0.73, calcium 9.2, glucose 143, lactic acid 1.74, and troponin 0.01. He was admitted to the hospitalist team for PNA and UTI. PCCM to consult for vent management.   SUBJECTIVE: no pressors On vent Awake, fc  VITAL SIGNS: Temp:  [97.9 F (36.6 C)-98.9 F (37.2 C)] 98 F (36.7 C) (11/22 0820) Pulse Rate:  [74-96] 90 (11/22 0735) Resp:  [12-33] 29 (11/22 0735) BP: (87-122)/(58-112) 111/68 (11/22 0735) SpO2:  [91 %-100 %] 91 % (11/22 0735) FiO2 (%):  [30 %] 30 % (11/22 0735) Weight:  [77.4 kg (170 lb 10.2 oz)] 77.4 kg (170 lb 10.2 oz) (11/21  1543)  PHYSICAL EXAMINATION: General:  Chronically ill appearing Neuro:  Alert, follows commands, nods yes/no appropriately HEENT:  [place]/AT, no rt eye, trach clean Cardiovascular:  RRR, no MRG Lungs:  Coarse bilateral Abdomen:  Soft, non-tender, non-distended. PEG site clean/dry Musculoskeletal:  R BKA, no edema Skin:  Grossly intact   Recent Labs Lab 05/15/16 1608 05/16/16 0335 05/17/16 0442  NA 133* 135 136  K 5.1 4.3 3.8  CL 90* 97* 98*  CO2 30 28 29   BUN 39* 26* 11  CREATININE 0.73 0.61 0.58*  GLUCOSE 143* 147* 155*    Recent Labs Lab 05/15/16 1608 05/16/16 0335 05/17/16 0442  HGB 9.7* 8.9* 9.1*  HCT 30.9* 28.0* 29.9*  WBC 19.2* 11.7* 11.0*  PLT 384 340 398   Ct Head Wo Contrast  Result Date: 05/16/2016 CLINICAL DATA:  Unresponsive patient.  Status post CPR. EXAM: CT HEAD WITHOUT CONTRAST TECHNIQUE: Contiguous axial images were obtained from the base of the skull through the vertex without intravenous contrast. COMPARISON:  None. FINDINGS: Brain: There is no midline shift or mass effect. There is no extra-axial collection. No intraparenchymal or extra-axial hemorrhage. Incidentally noted is a cavum septum pellucidum et vergae. There is no CT evidence of acute cortical infarct. The basal ganglia and insular cortices are preserved. No hydrocephalus. There is periventricular hypoattenuation compatible with chronic microvascular disease. Vascular: Atherosclerotic calcification of the internal carotid arteries is present at the skullbase. Skull: No skull fracture or focal calvarial lesion. Visualized skull base is normal.  Sinuses/Orbits: Fluid level in the right sphenoid sinus. Otherwise clear paranasal sinuses and mastoids. 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 The left orbit is normal. Other: None IMPRESSION: 1. No acute intracranial abnormality. 2. Findings of chronic microvascular ischemia. Electronically Signed   By: Deatra RobinsonKevin  Herman M.D.   On: 05/16/2016 06:46   Dg Chest Port 1  View  Result Date: 05/17/2016 CLINICAL DATA:  Tracheostomy. EXAM: PORTABLE CHEST 1 VIEW COMPARISON:  05/15/2016. FINDINGS: Tracheostomy in stable position. Right PICC line in stable position. Interim significant improvement of left lung atelectasis. Cardiomegaly with diffuse bilateral pulmonary infiltrates consistent with pulmonary edema, improved from prior exam. Lateral pleural effusions, improved from prior exam. Scratched IMPRESSION: 1. Tracheostomy tube and right PICC line stable position. 2. Interim significant improvement of left lung atelectasis. 3. Congestive heart failure with bilateral pulmonary interstitial edema and small bilateral pleural effusions. Electronically Signed   By: Maisie Fushomas  Register   On: 05/17/2016 09:06   Dg Chest Port 1 View  Result Date: 05/15/2016 CLINICAL DATA:  Unresponsive. Ventilated patient with tracheostomy tube. EXAM: PORTABLE CHEST 1 VIEW COMPARISON:  None. FINDINGS: Complete opacification of left hemithorax. There appears to be volume loss in left hemithorax suggesting left lung collapse and possibly related to mucous plugging. However, large left pleural effusion cannot be excluded. Diffuse airspace densities in the right lung may represent edema. PICC line tip near the superior cavoatrial junction. Tracheostomy tube is present. Heart is obscured by the left chest densities. IMPRESSION: Complete opacification of the left hemithorax probably related to collapse of the left lung from mucous plugging. Massive left pleural effusion cannot be excluded. Right lung airspace disease may represent pulmonary edema. Electronically Signed   By: Richarda OverlieAdam  Henn M.D.   On: 05/15/2016 16:26    ASSESSMENT / PLAN:  Acute on chronic hypoxemic respiratory failure secondary to mucous plugging in setting of likely HCAP: He has apparently struggled with plugs and thick secretions many times in the past, this is likely what limited weaning. Suspect resp/cardiac arrest is due to this as well.   - pcxr now resolved Appears with residual edema / layering effusions -need neg balance pcxr follow up needed Consider peep 8-10 for 48 hrs Ensure mucomysts x 48 hr Add chest pt NO SBT planned Lasix to neg balance, appears can tolerate diuretics Remains culture neg, dc vanc Empiric cefepime, consider 8 days  Will follow up Friday  Mcarthur Rossettianiel J. Tyson AliasFeinstein, MD, FACP Pgr: [phone] [place] [facility]

## 2016-05-17 NOTE — Progress Notes (Signed)
Initial Nutrition Assessment   INTERVENTION:   Vital AF 1.2 @ 70 ml/hr (1680 ml/day) via PEG 30 ml Prostat daily Provides: 2116 kcal, 141 grams protein, and 1362 ml H2O.   Continue vitamin supplementation  NUTRITION DIAGNOSIS:   Inadequate oral intake related to inability to eat as evidenced by NPO status.  GOAL:   Patient will meet greater than or equal to 90% of their needs  MONITOR:   I & O's, TF tolerance, Vent status, Labs  REASON FOR ASSESSMENT:   Ventilator, Consult Enteral/tube feeding initiation and management  ASSESSMENT:   Pt with hx of DM, COPD, chronic trach/vent/PEG after prolonged hospitalization for RLE amputation/sepsis who is from [facility] and admitted after brief cardiac arrest.   Per MD note acute on chronic hypoxemic respiratory failure secondary to mucous plugging in setting of likely HCAP.  Difficult to understand patient. Discussed with RN.   Patient is currently intubated on ventilator support MV: 22.3 L/min Temp (24hrs), Avg:98.2 F (36.8 C), Min:97.9 F (36.6 C), Max:98.9 F (37.2 C)  Medications reviewed and include: folvite, lasix, novolog, senokot, thiamine, vitamin C, zinc Labs reviewed. CBG's: [phone] Nutrition-Focused physical exam completed. Findings are no fat depletion, no muscle depletion, and no edema.   Diet Order:  Diet NPO time specified  Skin:  Wound (see comment) (stage IV PI to sacrum)  Last BM:  11/22 large  Height:   Ht Readings from Last 1 Encounters:  05/16/16 5\' 10"  (1.778 m)    Weight:   Wt Readings from Last 1 Encounters:  05/16/16 170 lb 10.2 oz (77.4 kg)    Ideal Body Weight:  70 kg  BMI:  Body mass index is 24.48 kg/m.  Estimated Nutritional Needs:   Kcal:  2100  Protein:  120-140 gram  Fluid:  > 2.1 L/day  EDUCATION NEEDS:   No education needs identified at this time  Kendell BaneHeather Daeveon Zweber RD, LDN, CNSC [phone] Pager [phone] After Hours Pager

## 2016-05-17 NOTE — Progress Notes (Signed)
CSW consult acknowledged re: "From [facility] , call into rep but poss from vent snf". CSW contacted [facility] admissions coordinator- voice message left. [facility] at [facility]. CSW following for disposition and return to SNF if medically appropriate and when Patient is stable for discharge.          Lance MussAshley Gardner,MSW, LCSW [facility] Clinical Social Worker [phone]

## 2016-05-17 NOTE — Progress Notes (Signed)
[facility] PROGRESS NOTE  Eric Sandoval HBZ:[id number] DOB: 08/13/1955 DOA: 05/15/2016  PCP: No primary care provider on file.  Brief History/Interval Summary: 60 year old African-American male with a past medical history of chronic respiratory failure s/p tracheostomy, A. fib with SVT, dysphagiam, s/p PEG, and diabetes mellitus type 2; who presented after having an episode of unresponsiveness. Patient has been residing at [facility] and was noted to be unresponsive without pulse at approximately 2:25 PM on the day of the admission. CPR was initiated andpatient was given 1 dose of epinephrine around 2:28pm. The patient was noted to regain consciousness and return of spontaneous circulation atapproximately 2:29 pm.Upon EMS arrival patient was noted to be hypotensive, but was able to follow commands and mouth words. Patient was seen by pulmonology. Due to concern for mucous plug, patient underwent bronchoscopy. He has improved.  Reason for Visit: Acute respiratory failure. Sepsis.  Consultants: Pulmonology  Procedures:  Bronchoscopy 11/20  Transthoracic echocardiogram is pending   Antibiotics: Vancomycin and cefepime  Subjective/Interval History: Patient states that he is feeling better. Denies any pain. Difficult to communicate with him due to his tracheostomy  ROS: Denies any nausea or vomiting  Objective:  Vital Signs  Vitals:   05/17/16 1000 05/17/16 1100 05/17/16 1128 05/17/16 1156  BP: 105/63 (!) 109/55  (!) 70/49  Pulse: 88 83  75  Resp: (!) 27 (!) 26    Temp:   98.4 F (36.9 C)   TempSrc:   Oral   SpO2: 97% 93%  97%  Weight:      Height:        Intake/Output Summary (Last 24 hours) at 05/17/16 1228 Last data filed at 05/16/16 2304  Gross per 24 hour  Intake              450 ml  Output             2000 ml  Net            -1550 ml   Filed Weights   05/16/16 1543  Weight: 77.4 kg (170 lb 10.2 oz)    General appearance: alert, cooperative  and no distress Resp: Good air entry bilaterally, although diminished at the bases. No crackles, wheezing or rhonchi. Cardio: regular rate and rhythm, S1, S2 normal, no murmur, click, rub or gallop GI: soft, non-tender; bowel sounds normal; no masses,  no organomegaly Extremities: extremities normal, atraumatic, no cyanosis or edema Neurologic: Awake, alert. Responds to questions. No obvious deficits  Lab Results:  Data Reviewed: I have personally reviewed following labs and imaging studies  CBC:  Recent Labs Lab 05/15/16 1608 05/16/16 0335 05/17/16 0442  WBC 19.2* 11.7* 11.0*  NEUTROABS 18.0*  --   --   HGB 9.7* 8.9* 9.1*  HCT 30.9* 28.0* 29.9*  MCV 90.4 89.7 90.6  PLT 384 340 101    Basic Metabolic Panel:  Recent Labs Lab 05/15/16 1608 05/16/16 0335 05/17/16 0442  NA 133* 135 136  K 5.1 4.3 3.8  CL 90* 97* 98*  CO2 30 28 29   GLUCOSE 143* 147* 155*  BUN 39* 26* 11  CREATININE 0.73 0.61 0.58*  CALCIUM 9.2 8.9 8.9    GFR: Estimated Creatinine Clearance: 101.4 mL/min (by C-G formula based on SCr of 0.58 mg/dL (L)).  Liver Function Tests:  Recent Labs Lab 05/15/16 1608 05/16/16 0335  AST 24 19  ALT 16* 16*  ALKPHOS 138* 130*  BILITOT 0.4 0.6  PROT 7.4 6.8  ALBUMIN 2.2* 2.1*  Cardiac Enzymes:  Recent Labs Lab 05/15/16 2109 05/16/16 0335 05/16/16 0845  TROPONINI 0.16* 0.24* 0.18*    CBG:  Recent Labs Lab 05/16/16 1528 05/16/16 2103 05/17/16 0340 05/17/16 0818 05/17/16 1200  GLUCAP 205* 198* 140* 165* 139*     Recent Results (from the past 240 hour(s))  Blood culture (routine x 2)     Status: None (Preliminary result)   Collection Time: 05/15/16  3:50 PM  Result Value Ref Range Status   Specimen Description BLOOD LEFT UPPER ARM  Final   Special Requests IN PEDIATRIC BOTTLE 1CC  Final   Culture NO GROWTH < 24 HOURS  Final   Report Status PENDING  Incomplete  Blood culture (routine x 2)     Status: None (Preliminary result)    Collection Time: 05/15/16  4:35 PM  Result Value Ref Range Status   Specimen Description BLOOD LEFT HAND  Final   Special Requests BOTTLES DRAWN AEROBIC AND ANAEROBIC 5CC  Final   Culture NO GROWTH < 24 HOURS  Final   Report Status PENDING  Incomplete  Urine culture     Status: Abnormal (Preliminary result)   Collection Time: 05/15/16  5:00 PM  Result Value Ref Range Status   Specimen Description URINE, CATHETERIZED  Final   Special Requests NONE  Final   Culture (A)  Final    >=100,000 COLONIES/mL ESCHERICHIA COLI >=100,000 COLONIES/mL ENTEROCOCCUS FAECALIS    Report Status PENDING  Incomplete   Organism ID, Bacteria ESCHERICHIA COLI (A)  Final      Susceptibility   Escherichia coli - MIC*    AMPICILLIN 16 INTERMEDIATE Intermediate     CEFAZOLIN <=4 SENSITIVE Sensitive     CEFTRIAXONE <=1 SENSITIVE Sensitive     CIPROFLOXACIN <=0.25 SENSITIVE Sensitive     GENTAMICIN <=1 SENSITIVE Sensitive     IMIPENEM <=0.25 SENSITIVE Sensitive     NITROFURANTOIN <=16 SENSITIVE Sensitive     TRIMETH/SULFA <=20 SENSITIVE Sensitive     AMPICILLIN/SULBACTAM 4 SENSITIVE Sensitive     PIP/TAZO <=4 SENSITIVE Sensitive     Extended ESBL NEGATIVE Sensitive     * >=100,000 COLONIES/mL ESCHERICHIA COLI  Culture, respiratory (NON-Expectorated)     Status: None (Preliminary result)   Collection Time: 05/15/16 10:40 PM  Result Value Ref Range Status   Specimen Description BRONCHIAL ALVEOLAR LAVAGE  Final   Special Requests NONE  Final   Gram Stain   Final    ABUNDANT WBC PRESENT,BOTH PMN AND MONONUCLEAR MODERATE GRAM VARIABLE ROD FEW GRAM POSITIVE COCCI IN PAIRS IN CLUSTERS    Culture MODERATE GRAM NEGATIVE RODS  Final   Report Status PENDING  Incomplete  MRSA PCR Screening     Status: Abnormal   Collection Time: 05/16/16  3:40 PM  Result Value Ref Range Status   MRSA by PCR POSITIVE (A) NEGATIVE Final    Comment:        The GeneXpert MRSA Assay (FDA approved for NASAL specimens only), is one  component of a comprehensive MRSA colonization surveillance program. It is not intended to diagnose MRSA infection nor to guide or monitor treatment for MRSA infections. RESULT CALLED TO, READ BACK BY AND VERIFIED WITH: C.CHRAPLIWY,RN AT 1922 BY L.PITT 05/16/16       Radiology Studies: Ct Head Wo Contrast  Result Date: 05/16/2016 CLINICAL DATA:  Unresponsive patient.  Status post CPR. EXAM: CT HEAD WITHOUT CONTRAST TECHNIQUE: Contiguous axial images were obtained from the base of the skull through the vertex without intravenous contrast. COMPARISON:  None. FINDINGS: Brain: There is no midline shift or mass effect. There is no extra-axial collection. No intraparenchymal or extra-axial hemorrhage. Incidentally noted is a cavum septum pellucidum et vergae. There is no CT evidence of acute cortical infarct. The basal ganglia and insular cortices are preserved. No hydrocephalus. There is periventricular hypoattenuation compatible with chronic microvascular disease. Vascular: Atherosclerotic calcification of the internal carotid arteries is present at the skullbase. Skull: No skull fracture or focal calvarial lesion. Visualized skull base is normal. Sinuses/Orbits: Fluid level in the right sphenoid sinus. Otherwise clear paranasal sinuses and mastoids. 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 The left orbit is normal. Other: None IMPRESSION: 1. No acute intracranial abnormality. 2. Findings of chronic microvascular ischemia. Electronically Signed   By: Ulyses Jarred M.D.   On: 05/16/2016 06:46   Dg Chest Port 1 View  Result Date: 05/17/2016 CLINICAL DATA:  Tracheostomy. EXAM: PORTABLE CHEST 1 VIEW COMPARISON:  05/15/2016. FINDINGS: Tracheostomy in stable position. Right PICC line in stable position. Interim significant improvement of left lung atelectasis. Cardiomegaly with diffuse bilateral pulmonary infiltrates consistent with pulmonary edema, improved from prior exam. Lateral pleural effusions, improved  from prior exam. Scratched IMPRESSION: 1. Tracheostomy tube and right PICC line stable position. 2. Interim significant improvement of left lung atelectasis. 3. Congestive heart failure with bilateral pulmonary interstitial edema and small bilateral pleural effusions. Electronically Signed   By: Marcello Moores  Register   On: 05/17/2016 09:06   Dg Chest Port 1 View  Result Date: 05/15/2016 CLINICAL DATA:  Unresponsive. Ventilated patient with tracheostomy tube. EXAM: PORTABLE CHEST 1 VIEW COMPARISON:  None. FINDINGS: Complete opacification of left hemithorax. There appears to be volume loss in left hemithorax suggesting left lung collapse and possibly related to mucous plugging. However, large left pleural effusion cannot be excluded. Diffuse airspace densities in the right lung may represent edema. PICC line tip near the superior cavoatrial junction. Tracheostomy tube is present. Heart is obscured by the left chest densities. IMPRESSION: Complete opacification of the left hemithorax probably related to collapse of the left lung from mucous plugging. Massive left pleural effusion cannot be excluded. Right lung airspace disease may represent pulmonary edema. Electronically Signed   By: Markus Daft M.D.   On: 05/15/2016 16:26     Medications:  Scheduled: . acetylcysteine  3 mL Nebulization BID  . acetylcysteine  4 mL Nebulization BID  . albuterol  2.5 mg Nebulization Q6H  . carvedilol  12.5 mg Per Tube BID WC  . ceFEPime (MAXIPIME) IV  2 g Intravenous Q8H  . chlorhexidine gluconate (MEDLINE KIT)  15 mL Mouth Rinse BID  . Chlorhexidine Gluconate Cloth  6 each Topical Q0600  . citalopram  10 mg Per Tube Daily  . enoxaparin  40 mg Subcutaneous Q24H  . famotidine  20 mg Oral BID  . folic acid  1 mg Per Tube Daily  . furosemide  40 mg Intravenous Q12H  . insulin aspart  0-9 Units Subcutaneous Q6H  . mouth rinse  15 mL Mouth Rinse QID  . mirtazapine  15 mg Per Tube QHS  . mupirocin ointment  1 application  Nasal BID  . predniSONE  5 mg Per Tube Q breakfast  . scopolamine  1 patch Transdermal Q72H  . sennosides  5 mL Per Tube QHS  . thiamine  100 mg Per Tube Daily  . Valproate Sodium  125 mg Oral BID  . Valproate Sodium  500 mg Per Tube QHS  . vitamin C  500 mg Per  Tube Daily  . zinc sulfate  220 mg Oral Daily   Continuous: . dextrose 5 % and 0.45% NaCl 75 mL/hr at 05/17/16 0700   BUL:AGTXMIWOEHOZY, clonazePAM, lactulose, loperamide, ondansetron **OR** ondansetron (ZOFRAN) IV, polyethylene glycol  Assessment/Plan:  Principal Problem:   Sepsis ([place]) Active Problems:   Chronic respiratory failure with hypoxia and hypercapnia (HCC)   Dysphagia   S/P percutaneous endoscopic gastrostomy (PEG) tube placement (HCC)   Tracheostomy dependence ([place])   Dehydration   Diabetes mellitus type 2, insulin dependent (HCC)   Anxiety and depression   Sacral decubitus ulcer   Anemia   Complicated UTI (urinary tract infection)   HCAP (healthcare-associated pneumonia)    Sepsis 2/2 suspectedComplicatedUTI /HCAP - WBC was elevated at 19.2 with a positive urinalysis and abnormal chest x-ray.  - Patient was given fluid bolus per sepsis protocol and started on vancomycin and cefepime - admitted to SDU - Urine cultures grew Escherichia coli. Blood cultures have been negative so far. Follow-up on sputum culture. - Will need to change out Foley at some point.  Acute on Chronic respiratory failurewith hypoxia and hypercapnia s/p tracheostomy/mucous plugging - Patient chronically vent dependent - Continue scopolamine patch, prednisone, albuterolbreathing treatment - Consulted PCCMfor ventmanagement  - pt underwent bronch by Dr. Elsworth Soho, purulent secretion removed from left main bronchus  - Some concern for volume overload based on chest x-ray. Stop IV fluids. Continue with Lasix. Strict ins and outs. Daily weights.  Unresponsiveness, elevated troponins - question cardiac and/or respiratory arrest:    - Resolved. Patient apparently had reportedly lost pulse and consciousness - Patient had some minimally elevated troponin. Since there is some concern for possible cardiac arrest at [facility], we will proceed with echocardiogram.  Dysphagia S/p PEG - Continue tube feedings  Dehydration - Patient presented with elevated BUN to creatinine ratio.  - Patient also found to have low chloride and sodium levels. - Patient was given IV fluids initially. We discontinued due to concern for volume overload.  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 Secure with tape. Change daily and as needed - appreciate assistance   Anemia: Chronic.  - Hemoglobin on 9.7 which appears near patient's baseline per review of records from [facility] where patient's Hg was ~10 on 11/10 - Continue to monitor  GERD with no esophagitis  - continue Pepcid  DVT Prophylaxis: Lovenox    Code Status: Full code  Family Communication: No family at bedside  Disposition Plan: Management as outlined above. Anticipate he'll be here the next 1-2 days. And then return to [facility].     LOS: 2 days   [facility] Pager [phone] 05/17/2016, 12:28 PM  If 7PM-7AM, please contact night-coverage at [email or web address], password [facility]

## 2016-05-18 ENCOUNTER — Inpatient Hospital Stay (HOSPITAL_COMMUNITY): Payer: Medicare Other

## 2016-05-18 DIAGNOSIS — E119 Type 2 diabetes mellitus without complications: Secondary | ICD-10-CM

## 2016-05-18 DIAGNOSIS — Z794 Long term (current) use of insulin: Secondary | ICD-10-CM

## 2016-05-18 DIAGNOSIS — J9621 Acute and chronic respiratory failure with hypoxia: Secondary | ICD-10-CM

## 2016-05-18 DIAGNOSIS — Z930 Tracheostomy status: Secondary | ICD-10-CM

## 2016-05-18 LAB — CBC
HCT: 28.7 % — ABNORMAL LOW (ref 39.0–52.0)
HEMOGLOBIN: 9 g/dL — AB (ref 13.0–17.0)
MCH: 28.5 pg (ref 26.0–34.0)
MCHC: 31.4 g/dL (ref 30.0–36.0)
MCV: 90.8 fL (ref 78.0–100.0)
Platelets: 400 10*3/uL (ref 150–400)
RBC: 3.16 MIL/uL — AB (ref 4.22–5.81)
RDW: 15.6 % — ABNORMAL HIGH (ref 11.5–15.5)
WBC: 12.1 10*3/uL — ABNORMAL HIGH (ref 4.0–10.5)

## 2016-05-18 LAB — BASIC METABOLIC PANEL
Anion gap: 10 (ref 5–15)
BUN: 18 mg/dL (ref 6–20)
CHLORIDE: 100 mmol/L — AB (ref 101–111)
CO2: 30 mmol/L (ref 22–32)
Calcium: 8.8 mg/dL — ABNORMAL LOW (ref 8.9–10.3)
Creatinine, Ser: 0.61 mg/dL (ref 0.61–1.24)
GFR calc Af Amer: >60 mL/min (ref >60)
GFR calc non Af Amer: >60 mL/min (ref >60)
GLUCOSE: 240 mg/dL — AB (ref 65–99)
POTASSIUM: 3.9 mmol/L (ref 3.5–5.1)
SODIUM: 140 mmol/L (ref 135–145)

## 2016-05-18 LAB — URINE CULTURE: Culture: >=100000 — AB

## 2016-05-18 LAB — ECHOCARDIOGRAM COMPLETE
HEIGHTINCHES: 70 in
Weight: 2638.47 oz

## 2016-05-18 LAB — GLUCOSE, CAPILLARY
GLUCOSE-CAPILLARY: 211 mg/dL — AB (ref 65–99)
GLUCOSE-CAPILLARY: 240 mg/dL — AB (ref 65–99)
GLUCOSE-CAPILLARY: 276 mg/dL — AB (ref 65–99)
Glucose-Capillary: 280 mg/dL — ABNORMAL HIGH (ref 65–99)
Glucose-Capillary: 231 mg/dL — ABNORMAL HIGH (ref 65–99)
Glucose-Capillary: 228 mg/dL — ABNORMAL HIGH (ref 65–99)
Glucose-Capillary: 242 mg/dL — ABNORMAL HIGH (ref 65–99)

## 2016-05-18 LAB — PHOSPHORUS
PHOSPHORUS: 2.2 mg/dL — AB (ref 2.5–4.6)
Phosphorus: 2.4 mg/dL — ABNORMAL LOW (ref 2.5–4.6)

## 2016-05-18 LAB — MAGNESIUM
MAGNESIUM: 2 mg/dL (ref 1.7–2.4)
MAGNESIUM: 2 mg/dL (ref 1.7–2.4)

## 2016-05-18 MED ORDER — SODIUM CHLORIDE 3 % IN NEBU
4.0000 mL | INHALATION_SOLUTION | Freq: Two times a day (BID) | RESPIRATORY_TRACT | Status: DC
  Administered 2016-05-19 (×2): 4 mL via RESPIRATORY_TRACT
  Filled 2016-05-18 (×5): qty 4

## 2016-05-18 NOTE — Progress Notes (Signed)
[facility] PROGRESS NOTE  Eric Sandoval RCV:[id number] DOB: 04/17/1956 DOA: 05/15/2016  PCP: No primary care provider on file.  Brief History/Interval Summary: 60 year old African-American male with a past medical history of chronic respiratory failure s/p tracheostomy, A. fib with SVT, dysphagiam, s/p PEG, and diabetes mellitus type 2; who presented after having an episode of unresponsiveness. Patient has been residing at [facility] and was noted to be unresponsive without pulse at approximately 2:25 PM on the day of the admission. CPR was initiated andpatient was given 1 dose of epinephrine around 2:28pm. The patient was noted to regain consciousness and return of spontaneous circulation atapproximately 2:29 pm.Upon EMS arrival patient was noted to be hypotensive, but was able to follow commands and mouth words. Patient was seen by pulmonology. Due to concern for mucous plug, patient underwent bronchoscopy. He has improved.  Reason for Visit: Acute respiratory failure. Sepsis.  Consultants: Pulmonology  Procedures:  Bronchoscopy 11/20  Transthoracic echocardiogram is pending   Antibiotics: Initially started on Vancomycin and cefepime Vancomycin discontinued 11/22 Now only on cefepime  Subjective/Interval History: Patient apparently had a restless night. Denies any complaints this morning. Difficult to communicate with him due to his tracheostomy  ROS: Denies any nausea or vomiting  Objective:  Vital Signs  Vitals:   05/18/16 0000 05/18/16 0300 05/18/16 0401 05/18/16 0500  BP:      Pulse: 85 96    Resp: (!) 28 (!) 31    Temp:      TempSrc: Oral Oral    SpO2: 96% 97% 95%   Weight:    74.8 kg (164 lb 14.5 oz)  Height:        Intake/Output Summary (Last 24 hours) at 05/18/16 0756 Last data filed at 05/17/16 1725  Gross per 24 hour  Intake                0 ml  Output             2350 ml  Net            -2350 ml   Filed Weights   05/16/16 1543  05/17/16 1500 05/18/16 0500  Weight: 77.4 kg (170 lb 10.2 oz) 78.4 kg (172 lb 13.5 oz) 74.8 kg (164 lb 14.5 oz)    General appearance: alert, cooperative and no distress Resp: Good air entry bilaterally, although diminished at the bases. No crackles, wheezing or rhonchi. Cardio: regular rate and rhythm, S1, S2 normal, no murmur, click, rub or gallop GI: soft, non-tender; bowel sounds normal; no masses,  no organomegaly Extremities: extremities normal, atraumatic, no cyanosis or edema  Neurologic: Awake, alert. Responds to questions. No obvious deficits  Lab Results:  Data Reviewed: I have personally reviewed following labs and imaging studies  CBC:  Recent Labs Lab 05/15/16 1608 05/16/16 0335 05/17/16 0442 05/18/16 0647  WBC 19.2* 11.7* 11.0* 12.1*  NEUTROABS 18.0*  --   --   --   HGB 9.7* 8.9* 9.1* 9.0*  HCT 30.9* 28.0* 29.9* 28.7*  MCV 90.4 89.7 90.6 90.8  PLT 384 340 398 102    Basic Metabolic Panel:  Recent Labs Lab 05/15/16 1608 05/16/16 0335 05/17/16 0442 05/17/16 1834 05/18/16 0647  NA 133* 135 136  --  140  K 5.1 4.3 3.8  --  3.9  CL 90* 97* 98*  --  100*  CO2 _0 --  30  GLUCOSE 143* 147* 155*  --  240*  BUN 39* 26* 11  --  18  CREATININE 0.73 0.61 0.58*  --  0.61  CALCIUM 9.2 8.9 8.9  --  8.8*  MG  --   --   --  2.1 2.0  PHOS  --   --   --  4.1 2.4*    GFR: Estimated Creatinine Clearance: 101.4 mL/min (by C-G formula based on SCr of 0.61 mg/dL).  Liver Function Tests:  Recent Labs Lab 05/15/16 1608 05/16/16 0335  AST 24 19  ALT 16* 16*  ALKPHOS 138* 130*  BILITOT 0.4 0.6  PROT 7.4 6.8  ALBUMIN 2.2* 2.1*    Cardiac Enzymes:  Recent Labs Lab 05/15/16 2109 05/16/16 0335 05/16/16 0845  TROPONINI 0.16* 0.24* 0.18*    CBG:  Recent Labs Lab 05/17/16 0340 05/17/16 0818 05/17/16 1200 05/17/16 1617 05/18/16 0028  GLUCAP 140* 165* 139* 163* 211*     Recent Results (from the past 240 hour(s))  Blood culture (routine x 2)      Status: None (Preliminary result)   Collection Time: 05/15/16  3:50 PM  Result Value Ref Range Status   Specimen Description BLOOD LEFT UPPER ARM  Final   Special Requests IN PEDIATRIC BOTTLE 1CC  Final   Culture NO GROWTH 2 DAYS  Final   Report Status PENDING  Incomplete  Blood culture (routine x 2)     Status: None (Preliminary result)   Collection Time: 05/15/16  4:35 PM  Result Value Ref Range Status   Specimen Description BLOOD LEFT HAND  Final   Special Requests BOTTLES DRAWN AEROBIC AND ANAEROBIC 5CC  Final   Culture NO GROWTH 2 DAYS  Final   Report Status PENDING  Incomplete  Urine culture     Status: Abnormal (Preliminary result)   Collection Time: 05/15/16  5:00 PM  Result Value Ref Range Status   Specimen Description URINE, CATHETERIZED  Final   Special Requests NONE  Final   Culture (A)  Final    >=100,000 COLONIES/mL ESCHERICHIA COLI >=100,000 COLONIES/mL ENTEROCOCCUS FAECALIS    Report Status PENDING  Incomplete   Organism ID, Bacteria ESCHERICHIA COLI (A)  Final      Susceptibility   Escherichia coli - MIC*    AMPICILLIN 16 INTERMEDIATE Intermediate     CEFAZOLIN <=4 SENSITIVE Sensitive     CEFTRIAXONE <=1 SENSITIVE Sensitive     CIPROFLOXACIN <=0.25 SENSITIVE Sensitive     GENTAMICIN <=1 SENSITIVE Sensitive     IMIPENEM <=0.25 SENSITIVE Sensitive     NITROFURANTOIN <=16 SENSITIVE Sensitive     TRIMETH/SULFA <=20 SENSITIVE Sensitive     AMPICILLIN/SULBACTAM 4 SENSITIVE Sensitive     PIP/TAZO <=4 SENSITIVE Sensitive     Extended ESBL NEGATIVE Sensitive     * >=100,000 COLONIES/mL ESCHERICHIA COLI  Culture, respiratory (NON-Expectorated)     Status: None (Preliminary result)   Collection Time: 05/15/16 10:40 PM  Result Value Ref Range Status   Specimen Description BRONCHIAL ALVEOLAR LAVAGE  Final   Special Requests NONE  Final   Gram Stain   Final    ABUNDANT WBC PRESENT,BOTH PMN AND MONONUCLEAR MODERATE GRAM VARIABLE ROD FEW GRAM POSITIVE COCCI IN  PAIRS IN CLUSTERS    Culture MODERATE GRAM NEGATIVE RODS  Final   Report Status PENDING  Incomplete  MRSA PCR Screening     Status: Abnormal   Collection Time: 05/16/16  3:40 PM  Result Value Ref Range Status   MRSA by PCR POSITIVE (A) NEGATIVE Final    Comment:        The  GeneXpert MRSA Assay (FDA approved for NASAL specimens only), is one component of a comprehensive MRSA colonization surveillance program. It is not intended to diagnose MRSA infection nor to guide or monitor treatment for MRSA infections. RESULT CALLED TO, READ BACK BY AND VERIFIED WITH: C.CHRAPLIWY,RN AT 1922 BY L.PITT 05/16/16       Radiology Studies: Dg Chest Port 1 View  Result Date: 05/17/2016 CLINICAL DATA:  Tracheostomy. EXAM: PORTABLE CHEST 1 VIEW COMPARISON:  05/15/2016. FINDINGS: Tracheostomy in stable position. Right PICC line in stable position. Interim significant improvement of left lung atelectasis. Cardiomegaly with diffuse bilateral pulmonary infiltrates consistent with pulmonary edema, improved from prior exam. Lateral pleural effusions, improved from prior exam. Scratched IMPRESSION: 1. Tracheostomy tube and right PICC line stable position. 2. Interim significant improvement of left lung atelectasis. 3. Congestive heart failure with bilateral pulmonary interstitial edema and small bilateral pleural effusions. Electronically Signed   By: Marcello Moores  Register   On: 05/17/2016 09:06     Medications:  Scheduled: . acetylcysteine  4 mL Nebulization BID  . albuterol  2.5 mg Nebulization Q6H  . carvedilol  12.5 mg Per Tube BID WC  . ceFEPime (MAXIPIME) IV  2 g Intravenous Q8H  . chlorhexidine gluconate (MEDLINE KIT)  15 mL Mouth Rinse BID  . Chlorhexidine Gluconate Cloth  6 each Topical Q0600  . citalopram  10 mg Per Tube Daily  . enoxaparin  40 mg Subcutaneous Q24H  . famotidine  20 mg Oral BID  . feeding supplement (PRO-STAT SUGAR FREE 64)  30 mL Per Tube Daily  . folic acid  1 mg Per Tube  Daily  . furosemide  40 mg Intravenous Q12H  . insulin aspart  0-9 Units Subcutaneous Q4H  . mouth rinse  15 mL Mouth Rinse QID  . mirtazapine  15 mg Per Tube QHS  . mupirocin ointment  1 application Nasal BID  . predniSONE  5 mg Per Tube Q breakfast  . scopolamine  1 patch Transdermal Q72H  . sennosides  5 mL Per Tube QHS  . thiamine  100 mg Per Tube Daily  . Valproate Sodium  125 mg Oral BID  . Valproate Sodium  500 mg Per Tube QHS  . vitamin C  500 mg Per Tube Daily  . zinc sulfate  220 mg Oral Daily   Continuous: . feeding supplement (VITAL AF 1.2 CAL) 1,000 mL (05/18/16 0529)   NTZ:GYFVCBSWHQPRF, clonazePAM, lactulose, loperamide, ondansetron **OR** ondansetron (ZOFRAN) IV, polyethylene glycol  Assessment/Plan:  Principal Problem:   Sepsis ([place]) Active Problems:   Chronic respiratory failure with hypoxia and hypercapnia (HCC)   Dysphagia   S/P percutaneous endoscopic gastrostomy (PEG) tube placement (HCC)   Tracheostomy dependence ([place])   Dehydration   Diabetes mellitus type 2, insulin dependent (HCC)   Anxiety and depression   Sacral decubitus ulcer   Anemia   Complicated UTI (urinary tract infection)   HCAP (healthcare-associated pneumonia)    Sepsis 2/2 suspectedComplicatedUTI /HCAP - WBC was elevated at 19.2 with a positive urinalysis and abnormal chest x-ray.  - Patient was given fluid bolus per sepsis protocol and started on vancomycin and cefepime - admitted to SDU - Urine cultures grew Escherichia coli. Blood cultures have been negative so far. Follow-up on sputum culture. - Will need to change out Foley.  Acute on Chronic respiratory failurewith hypoxia and hypercapnia s/p tracheostomy/mucous plugging - Patient chronically vent dependent - Continue scopolamine patch, prednisone, albuterolbreathing treatment - PCCMIs following for ventmanagement  - Patient underwent bronch  by Dr. Elsworth Soho, purulent secretion removed from left main bronchus  - Some  concern for volume overload based on chest x-ray. Stopped IV fluids. Continue with Lasix for today. Strict ins and outs. Daily weights. Echocardiogram is pending.  Unresponsiveness, elevated troponins - question cardiac and/or respiratory arrest:  - Resolved. Patient apparently had reportedly lost pulse and consciousness - Patient had some minimally elevated troponin. Since there is some concern for possible cardiac arrest at [facility], we will proceed with echocardiogram.  Dysphagia S/p PEG - Continue tube feedings  Dehydration - Patient was given IV fluids initially. We discontinued due to concern for volume overload.  Chronic indwelling foleycatheter - Will place order to replace catheter   Paroxysmal atrial fibrillation with history of SVT - Continue Coreg. Not noted to be on anticoagulation.  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 Secure with tape. Change daily and as needed - appreciate assistance   Anemia: Chronic.  - Hemoglobin appears to be close to baseline - Continue to monitor  GERD with no esophagitis  - continue Pepcid  DVT Prophylaxis: Lovenox    Code Status: Full code  Family Communication: No family at bedside  Disposition Plan: Management as outlined above. Await echocardiogram. Anticipate discharge back to [facility] tomorrow.     LOS: 3 days   [facility] Pager [phone] 05/18/2016, 7:56 AM  If 7PM-7AM, please contact night-coverage at [email or web address], password [facility]

## 2016-05-18 NOTE — Progress Notes (Signed)
Echocardiogram 2D Echocardiogram has been performed.  Eric Sandoval 05/18/2016, 2:07 PM

## 2016-05-18 NOTE — Progress Notes (Signed)
Name: Eric Sandoval MRN: [id number] DOB: [birth year]    ADMISSION DATE:  05/15/2016 CONSULTATION DATE:  05/15/2016  REFERRING MD :  Dr. Katrinka BlazingSmith [facility]  CHIEF COMPLAINT:  AMS  BRIEF PATIENT DESCRIPTION: 60 y.o. male with chronic trach/vent/PEG suffered brief cardiac arrest at [facility]. Responsive and following commands in ED. Complete opacification of L hemithorax on CXR. PCCM to consult for PNA and vent management.  SUBJECTIVE: Respiratory therapist reporting patient requiring high tracheostomy collar pressures with occasional leaking around tracheostomy. Patient's tracheostomy cuff is partially deflated.  REVIEW OF SYSTEMS:  Unable to obtain as patient is currently on ventilator with tracheostomy in place.  VITAL SIGNS: Temp:  [98.2 F (36.8 C)-100.7 F (38.2 C)] 100.7 F (38.2 C) (11/23 1215) Pulse Rate:  [72-104] 90 (11/23 1215) Resp:  [19-31] 27 (11/23 1215) BP: (86-143)/(54-75) 101/62 (11/23 1215) SpO2:  [95 %-100 %] 96 % (11/23 1510) FiO2 (%):  [30 %] 30 % (11/23 1510) Weight:  [164 lb 14.5 oz (74.8 kg)] 164 lb 14.5 oz (74.8 kg) (11/23 0500)  PHYSICAL EXAMINATION: General:  Awake. Watching TV. No family at bedside.  Integument:  Warm & dry. No rash on exposed skin.  HEENT:  Moist mucus membranes. Tracheostomy in place with a Shiley #7 cuffed. Cardiovascular:  Regular rate. No appreciable JVD. Pulmonary:  Good aeration bilaterally anteriorly. Normal work of breathing on ventilator. Patient losing approximately 200-300 mL of tidal volume on exhalation. Abdomen: Soft. Normal bowel sounds. Nondistended.  Neurological: Patient attends to voice and attempts to answer questions but is unable to be understood. Moving upper extremities equally.    Recent Labs Lab 05/16/16 0335 05/17/16 0442 05/18/16 0647  NA 135 136 140  K 4.3 3.8 3.9  CL 97* 98* 100*  CO2 28 29 30   BUN 26* 11 18  CREATININE 0.61 0.58* 0.61  GLUCOSE 147* 155* 240*    Recent Labs Lab 05/16/16 0335  05/17/16 0442 05/18/16 0647  HGB 8.9* 9.1* 9.0*  HCT 28.0* 29.9* 28.7*  WBC 11.7* 11.0* 12.1*  PLT 340 398 400   Dg Chest Port 1 View  Result Date: 05/17/2016 CLINICAL DATA:  Tracheostomy. EXAM: PORTABLE CHEST 1 VIEW COMPARISON:  05/15/2016. FINDINGS: Tracheostomy in stable position. Right PICC line in stable position. Interim significant improvement of left lung atelectasis. Cardiomegaly with diffuse bilateral pulmonary infiltrates consistent with pulmonary edema, improved from prior exam. Lateral pleural effusions, improved from prior exam. Scratched IMPRESSION: 1. Tracheostomy tube and right PICC line stable position. 2. Interim significant improvement of left lung atelectasis. 3. Congestive heart failure with bilateral pulmonary interstitial edema and small bilateral pleural effusions. Electronically Signed   By: Maisie Fushomas  Register   On: 05/17/2016 09:06   SIGNIFICANT EVENTS:  11/20 - Admit  STUDIES:  CT HEAD W/O 11/21:  No acute abnormality. Chronic microvascular ischemia.  Port CXR 11/22:  Personally reviewed by me. Improved aeration left upper lung when compared with previous chest x-ray from 11/20. Continued hazy bilateral lower lung opacities suggestive of pleural effusions. TTE 11/23: LVEF 55-60% with normal diastolic function. No regional wall motion abnormalities. LA normal in size & RA upper limits of normal in size. RV mildly dilated with low normal systolic function. No aortic regurgitation. Aortic root normal in size. Trivial mitral regurgitation. No significant pulmonic regurgitation. Trivial tricuspid regurgitation. No pericardial effusion.  MICROBIOLOGY: Blood Ctx x2 11/20 >> Urine Ctx 11/20:  Enterococcus & E coli Tracheal Asp Ctx 11/20 >> Moderate Acinetobacter baumanni w/ sensitivities pending MRSA PCR 11/20:  Positive  ANTIBIOTICS: Cefepime 11/20 >> Vancomycin 11/20 >>  ASSESSMENT / PLAN:  60 y.o. male with ventilator dependence status post tracheostomy and acute  on chronic hypoxic respiratory failure that is likely multifactorial. Patient is culturing out Acinetobacter from his tracheal aspirate. Aeration of his left lung seems to be improving. Patient does have several cuff leak from his tracheostomy. Due to his tracheostomy being an abnormal type he will require dilation to a standard #6 and we will have to order a distal XLT.   1. Acute on Chronic Hypoxic Respiratory Failure:Continue airway clearance with 3% hypertonic saline nebulized twice daily & albuterol nebulized every 6 hours. Recommend continue diuresis with Lasix as renal function allows.  2. HCAP:  Awaiting sensitivities for tracheal aspirate culture. Continuing on empiric vancomycin and cefepime. 3. Ventilator Dependence/Tracheostomy:  There is a cuff leak from patient's tracheostomy. He has a #7 distal XLT with a significantly small OD than our stocked tracheostomies due to his current tracheostomy being a single lumen. Patient would re-quire up-sizing to a standard #6 tracheostomy diameter and order a distal XLT Shiley #6 for replacement. Plan for dilation as soon as replacement arrives.   Remainder of care as per primary service.   Donna ChristenJennings E. Jamison NeighborNestor, M.D. [facility] Pager:  [phone] After 3pm or if no response, call [phone] 4:16 PM 05/18/16

## 2016-05-19 LAB — CULTURE, RESPIRATORY W GRAM STAIN

## 2016-05-19 LAB — CBC
HEMATOCRIT: 27.8 % — AB (ref 39.0–52.0)
HEMOGLOBIN: 8.6 g/dL — AB (ref 13.0–17.0)
MCH: 28.2 pg (ref 26.0–34.0)
MCHC: 30.9 g/dL (ref 30.0–36.0)
MCV: 91.1 fL (ref 78.0–100.0)
Platelets: 398 10*3/uL (ref 150–400)
RBC: 3.05 MIL/uL — ABNORMAL LOW (ref 4.22–5.81)
RDW: 16.1 % — AB (ref 11.5–15.5)
WBC: 14 10*3/uL — ABNORMAL HIGH (ref 4.0–10.5)

## 2016-05-19 LAB — GLUCOSE, CAPILLARY
Glucose-Capillary: 253 mg/dL — ABNORMAL HIGH (ref 65–99)
Glucose-Capillary: 230 mg/dL — ABNORMAL HIGH (ref 65–99)
Glucose-Capillary: 224 mg/dL — ABNORMAL HIGH (ref 65–99)
Glucose-Capillary: 278 mg/dL — ABNORMAL HIGH (ref 65–99)
Glucose-Capillary: 186 mg/dL — ABNORMAL HIGH (ref 65–99)

## 2016-05-19 LAB — BASIC METABOLIC PANEL
ANION GAP: 12 (ref 5–15)
BUN: 23 mg/dL — AB (ref 6–20)
CALCIUM: 9 mg/dL (ref 8.9–10.3)
CO2: 31 mmol/L (ref 22–32)
Chloride: 96 mmol/L — ABNORMAL LOW (ref 101–111)
Creatinine, Ser: 0.64 mg/dL (ref 0.61–1.24)
GFR calc Af Amer: >60 mL/min (ref >60)
GFR calc non Af Amer: >60 mL/min (ref >60)
GLUCOSE: 249 mg/dL — AB (ref 65–99)
POTASSIUM: 3.5 mmol/L (ref 3.5–5.1)
Sodium: 139 mmol/L (ref 135–145)

## 2016-05-19 MED ORDER — TRAMADOL HCL 50 MG PO TABS: 50.0000 mg | ORAL_TABLET | Freq: Three times a day (TID) | ORAL | 0 refills | Status: AC

## 2016-05-19 MED ORDER — FUROSEMIDE 10 MG/ML PO SOLN: 40.0000 mg | Freq: Every day | ORAL | 12 refills | Status: AC

## 2016-05-19 MED ORDER — ALBUTEROL SULFATE (2.5 MG/3ML) 0.083% IN NEBU: 2.5000 mg | INHALATION_SOLUTION | Freq: Four times a day (QID) | RESPIRATORY_TRACT | Status: DC | PRN

## 2016-05-19 MED ORDER — DEXTROSE 5 % IV SOLN: 2.0000 g | Freq: Three times a day (TID) | INTRAVENOUS | Status: AC

## 2016-05-19 MED ORDER — SODIUM CHLORIDE 3 % IN NEBU: 4.0000 mL | INHALATION_SOLUTION | Freq: Two times a day (BID) | RESPIRATORY_TRACT | 0 refills | Status: AC

## 2016-05-19 NOTE — Discharge Summary (Addendum)
[facility]  Physician Discharge Summary   Patient ID: Eric Sandoval MRN: [id number] DOB/AGE: 02-18-1956 60 y.o.  Admit date: 05/15/2016 Discharge date: 05/19/2016  PCP: No primary care provider on file.  DISCHARGE DIAGNOSES:  Principal Problem:   Sepsis (HCC) Active Problems:   Chronic respiratory failure with hypoxia and hypercapnia (HCC)   Dysphagia   S/P percutaneous endoscopic gastrostomy (PEG) tube placement (HCC)   Tracheostomy dependence (HCC)   Dehydration   Diabetes mellitus type 2, insulin dependent (HCC)   Anxiety and depression   Sacral decubitus ulcer   Anemia   Complicated UTI (urinary tract infection)   HCAP (healthcare-associated pneumonia)   Acute on chronic respiratory failure with hypoxia (HCC)   RECOMMENDATIONS FOR OUTPATIENT FOLLOW UP: 1. Continue tube feedings as before 2. Please see note below. Regarding his tracheostomy tube 3. Continue antibiotics as mentioned below 4. Noted to have a small cylindrical plastic tube sutured to the abdominal skin. Unclear what this is. The will need to be addressed at [facility].   DISCHARGE CONDITION: fair  Diet recommendation: Tube feedings as before  Filed Weights   05/17/16 1500 05/18/16 0500 05/19/16 0500  Weight: 78.4 kg (172 lb 13.5 oz) 74.8 kg (164 lb 14.5 oz) 71.4 kg (157 lb 6.5 oz)    INITIAL HISTORY: 60 year old African-American male with a past medical history of chronic respiratory failure s/p tracheostomy, A. fib with SVT, dysphagiam, s/p PEG, and diabetes mellitus type 2; who presentedafter having an episode of unresponsiveness. Patient has been residing at [facility] and was noted to be unresponsive without pulse at approximately 2:25 PM on the day of the admission. CPR was initiated andpatient was given 1 dose of epinephrine around 2:28pm. The patient was noted to regain consciousness and return of spontaneous circulation atapproximately 2:29 pm.Upon EMS arrival patient was noted  to be hypotensive, but was able to follow commands and mouth words  Consultants: Pulmonology  Procedures:  Bronchoscopy 11/20  Transthoracic echocardiogram  Study Conclusions - Left ventricle: The cavity size was normal. Wall thickness was at   the upper limits of normal. Systolic function was normal. The   estimated ejection fraction was in the range of 55% to 60%. Wall   motion was normal; there were no regional wall motion   abnormalities. Left ventricular diastolic function parameters   were normal. - Mitral valve: Calcified annulus. There was trivial regurgitation. - Right ventricle: The cavity size was mildly dilated. Systolic   function was low normal. - Right atrium: The atrium was at the upper limits of normal in   size. - Atrial septum: No defect or patent foramen ovale was identified. - Tricuspid valve: There was trivial regurgitation. - Pulmonary arteries: Systolic pressure could not be accurately   estimated. - Systemic veins: Unable to accurately estimate CVP. - Pericardium, extracardiac: There was no pericardial effusion. Impressions: - Upper normal LV wall thickness with LVEF 55-60%. Grossly normal   diastolic function. Mildly calcified mitral annulus with trivial   mitral regurgitation. Mild dilated right ventricle with low   normal contraction. Trivial tricuspid regurgitation. Upper normal   right atrial chamber size.  HOSPITAL COURSE:   Sepsis 2/2 suspected ComplicatedUTI /HCAP WBC was elevated at 19.2 with a positive urinalysis and abnormal chest x-ray. Patient was given fluid bolus per sepsis protocol and started on vancomycin and cefepime. Patient was admitted to the stepdown unit. Urine cultures grew Escherichia coli. UTI could be due to indwelling foley or could suggest colonization. Blood cultures have been negative so far.  Tracheal aspirate is noted to be growing acinetobacter baumannii. Noted to be resistant to all antibiotics. Discussed with  infectious disease. Significant of this finding is not entirely clear. Since patient has clinically improved would continue management as recommended by pulmonology. Cefepime for 5 more days.   Acute on Chronic respiratory failurewith hypoxia and hypercapnia s/p tracheostomy/mucous plugging Patient chronically vent dependent. He was followed by pulmonology while he was hospitalized. He patient was found to have severe atelectasis involving the left lung, thought to be secondary to mucous plugging. Patient was seen by pulmonology and underwent bronchoscopy.. In secretion was removed from the left main bronchus. Patient significantly improved subsequently. There was also some concern for fluid overload. Patient was given Lasix with improvement. Echocardiogram was done which showed normal systolic function. There was some concern for leakage around the tracheostomy. Seen by pulmonology for this issue. They feel like it may be reasonable to replace the trach with a #6 Distal XLT Shiley, however, they feel that by doing so. Patient may have more difficulty speaking around the trach. Discussed with Dr. Delton Coombes this morning. He prefers the this be considered at his long-term care facility. Continue his other medications as before.  Unresponsiveness, elevated troponins There was a question of cardiac/respiratory arrest. Apparently, he underwent CPR and had to be given epinephrine. These events were most likely due to mucous plug and hypoxia. Echocardiogram does not show any wall motion abnormalities. No need for any further cardiac workup at this time.  Dysphagia S/p PEG Continue tube feedings  Dehydration Patient was given IV fluids initially. We discontinued due to concern for volume overload.  Chronic indwelling foleycatheter Will place order to replace catheter   Paroxysmal atrial fibrillation with history of SVT Continue Coreg. Not noted to be on anticoagulation. Deferred to his outpatient  providers.  Diabetes mellitus type 2 Continue management as before  History of alcohol abuse Continue thiamine and folic acid  Anxiety/MDD Continue home medications.  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 Secure with tape. Change daily and as needed  Anemia: Chronic.  Hemoglobin appears to be close to baseline  Overall improved. Okay for discharge back to his long-term care facility.  PERTINENT LABS:  The results of significant diagnostics from this hospitalization (including imaging, microbiology, ancillary and laboratory) are listed below for reference.    Microbiology: Recent Results (from the past 240 hour(s))  Blood culture (routine x 2)     Status: None (Preliminary result)   Collection Time: 05/15/16  3:50 PM  Result Value Ref Range Status   Specimen Description BLOOD LEFT UPPER ARM  Final   Special Requests IN PEDIATRIC BOTTLE 1CC  Final   Culture NO GROWTH 3 DAYS  Final   Report Status PENDING  Incomplete  Blood culture (routine x 2)     Status: None (Preliminary result)   Collection Time: 05/15/16  4:35 PM  Result Value Ref Range Status   Specimen Description BLOOD LEFT HAND  Final   Special Requests BOTTLES DRAWN AEROBIC AND ANAEROBIC 5CC  Final   Culture NO GROWTH 3 DAYS  Final   Report Status PENDING  Incomplete  Urine culture     Status: Abnormal   Collection Time: 05/15/16  5:00 PM  Result Value Ref Range Status   Specimen Description URINE, CATHETERIZED  Final   Special Requests  NONE  Final   Culture (A)  Final    >=100,000 COLONIES/mL ESCHERICHIA COLI >=100,000 COLONIES/mL ENTEROCOCCUS FAECALIS VANCOMYCIN RESISTANT ENTEROCOCCUS    Report Status 05/18/2016 FINAL  Final   Organism ID, Bacteria ESCHERICHIA COLI (A)  Final   Organism ID, Bacteria ENTEROCOCCUS  FAECALIS (A)  Final      Susceptibility   Escherichia coli - MIC*    AMPICILLIN 16 INTERMEDIATE Intermediate     CEFAZOLIN <=4 SENSITIVE Sensitive     CEFTRIAXONE <=1 SENSITIVE Sensitive     CIPROFLOXACIN <=0.25 SENSITIVE Sensitive     GENTAMICIN <=1 SENSITIVE Sensitive     IMIPENEM <=0.25 SENSITIVE Sensitive     NITROFURANTOIN <=16 SENSITIVE Sensitive     TRIMETH/SULFA <=20 SENSITIVE Sensitive     AMPICILLIN/SULBACTAM 4 SENSITIVE Sensitive     PIP/TAZO <=4 SENSITIVE Sensitive     Extended ESBL NEGATIVE Sensitive     * >=100,000 COLONIES/mL ESCHERICHIA COLI   Enterococcus faecalis - MIC*    AMPICILLIN <=2 SENSITIVE Sensitive     LEVOFLOXACIN >=8 RESISTANT Resistant     NITROFURANTOIN <=16 SENSITIVE Sensitive     VANCOMYCIN >=32 RESISTANT Resistant     LINEZOLID 2 SENSITIVE Sensitive     * >=100,000 COLONIES/mL ENTEROCOCCUS FAECALIS  Culture, respiratory (NON-Expectorated)     Status: None   Collection Time: 05/15/16 10:40 PM  Result Value Ref Range Status   Specimen Description BRONCHIAL ALVEOLAR LAVAGE  Final   Special Requests NONE  Final   Gram Stain   Final    ABUNDANT WBC PRESENT,BOTH PMN AND MONONUCLEAR MODERATE GRAM VARIABLE ROD FEW GRAM POSITIVE COCCI IN PAIRS IN CLUSTERS    Culture   Final    MODERATE ACINETOBACTER CALCOACETICUS/BAUMANNII COMPLEX MULTI-DRUG RESISTANT ORGANISM RESULT CALLED TO, READ BACK BY AND VERIFIED WITH: Barnett Applebaum, NURSE AT 1241 ON [id number] BY Lucienne Capers    Report Status 05/19/2016 FINAL  Final   Organism ID, Bacteria ACINETOBACTER CALCOACETICUS/BAUMANNII COMPLEX  Final      Susceptibility   Acinetobacter calcoaceticus/baumannii complex - MIC*    CEFTAZIDIME >=64 RESISTANT Resistant     CEFTRIAXONE >=64 RESISTANT Resistant     CIPROFLOXACIN >=4 RESISTANT Resistant     GENTAMICIN >=16 RESISTANT Resistant     IMIPENEM >=16 RESISTANT Resistant     PIP/TAZO >=128 RESISTANT Resistant     TRIMETH/SULFA >=320 RESISTANT Resistant     CEFEPIME >=64  RESISTANT Resistant     AMPICILLIN/SULBACTAM 16 RESISTANT Resistant     * MODERATE ACINETOBACTER CALCOACETICUS/BAUMANNII COMPLEX  MRSA PCR Screening     Status: Abnormal   Collection Time: 05/16/16  3:40 PM  Result Value Ref Range Status   MRSA by PCR POSITIVE (A) NEGATIVE Final    Comment:        The GeneXpert MRSA Assay (FDA approved for NASAL specimens only), is one component of a comprehensive MRSA colonization surveillance program. It is not intended to diagnose MRSA infection nor to guide or monitor treatment for MRSA infections. RESULT CALLED TO, READ BACK BY AND VERIFIED WITH: C.CHRAPLIWY,RN AT 1922 BY L.PITT 05/16/16      Labs: Basic Metabolic Panel:  Recent Labs Lab 05/15/16 1608 05/16/16 0335 05/17/16 0442 05/17/16 1834 05/18/16 0647 05/18/16 1700 05/19/16 0345  NA 133* 135 136  --  140  --  139  K 5.1 4.3 3.8  --  3.9  --  3.5  CL 90* 97* 98*  --  100*  --  96*  CO2 30 28 29   --  30  --  31  GLUCOSE 143* 147* 155*  --  240*  --  249*  BUN 39* 26* 11  --  18  --  23*  CREATININE 0.73 0.61 0.58*  --  0.61  --  0.64  CALCIUM 9.2 8.9 8.9  --  8.8*  --  9.0  MG  --   --   --  2.1 2.0 2.0  --   PHOS  --   --   --  4.1 2.4* 2.2*  --    Liver Function Tests:  Recent Labs Lab 05/15/16 1608 05/16/16 0335  AST 24 19  ALT 16* 16*  ALKPHOS 138* 130*  BILITOT 0.4 0.6  PROT 7.4 6.8  ALBUMIN 2.2* 2.1*   CBC:  Recent Labs Lab 05/15/16 1608 05/16/16 0335 05/17/16 0442 05/18/16 0647 05/19/16 0345  WBC 19.2* 11.7* 11.0* 12.1* 14.0*  NEUTROABS 18.0*  --   --   --   --   HGB 9.7* 8.9* 9.1* 9.0* 8.6*  HCT 30.9* 28.0* 29.9* 28.7* 27.8*  MCV 90.4 89.7 90.6 90.8 91.1  PLT 384 340 398 400 398   Cardiac Enzymes:  Recent Labs Lab 05/15/16 2109 05/16/16 0335 05/16/16 0845  TROPONINI 0.16* 0.24* 0.18*   BNP: BNP (last 3 results)  Recent Labs  05/15/16 2109  BNP 241.7*    CBG:  Recent Labs Lab 05/18/16 2040 05/18/16 2310 05/19/16 0344  05/19/16 0806 05/19/16 1118  GLUCAP 228* 242* 253* 230* 224*     IMAGING STUDIES Ct Head Wo Contrast  Result Date: 05/16/2016 CLINICAL DATA:  Unresponsive patient.  Status post CPR. EXAM: CT HEAD WITHOUT CONTRAST TECHNIQUE: Contiguous axial images were obtained from the base of the skull through the vertex without intravenous contrast. COMPARISON:  None. FINDINGS: Brain: There is no midline shift or mass effect. There is no extra-axial collection. No intraparenchymal or extra-axial hemorrhage. Incidentally noted is a cavum septum pellucidum et vergae. There is no CT evidence of acute cortical infarct. The basal ganglia and insular cortices are preserved. No hydrocephalus. There is periventricular hypoattenuation compatible with chronic microvascular disease. Vascular: Atherosclerotic calcification of the internal carotid arteries is present at the skullbase. Skull: No skull fracture or focal calvarial lesion. Visualized skull base is normal. Sinuses/Orbits: Fluid level in the right sphenoid sinus. Otherwise clear paranasal sinuses and mastoids. 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 The left orbit is normal. Other: None IMPRESSION: 1. No acute intracranial abnormality. 2. Findings of chronic microvascular ischemia. Electronically Signed   By: Deatra RobinsonKevin  Herman M.D.   On: 05/16/2016 06:46   Dg Chest Port 1 View  Result Date: 05/17/2016 CLINICAL DATA:  Tracheostomy. EXAM: PORTABLE CHEST 1 VIEW COMPARISON:  05/15/2016. FINDINGS: Tracheostomy in stable position. Right PICC line in stable position. Interim significant improvement of left lung atelectasis. Cardiomegaly with diffuse bilateral pulmonary infiltrates consistent with pulmonary edema, improved from prior exam. Lateral pleural effusions, improved from prior exam. Scratched IMPRESSION: 1. Tracheostomy tube and right PICC line stable position. 2. Interim significant improvement of left lung atelectasis. 3. Congestive heart failure with bilateral pulmonary  interstitial edema and small bilateral pleural effusions. Electronically Signed   By: Maisie Fushomas  Register   On: 05/17/2016 09:06   Dg Chest Port 1 View  Result Date: 05/15/2016 CLINICAL DATA:  Unresponsive. Ventilated patient with tracheostomy tube. EXAM: PORTABLE CHEST 1 VIEW COMPARISON:  None. FINDINGS: Complete opacification of left hemithorax. There appears to be volume loss in left hemithorax suggesting left lung collapse and possibly related  to mucous plugging. However, large left pleural effusion cannot be excluded. Diffuse airspace densities in the right lung may represent edema. PICC line tip near the superior cavoatrial junction. Tracheostomy tube is present. Heart is obscured by the left chest densities. IMPRESSION: Complete opacification of the left hemithorax probably related to collapse of the left lung from mucous plugging. Massive left pleural effusion cannot be excluded. Right lung airspace disease may represent pulmonary edema. Electronically Signed   By: Richarda Overlie M.D.   On: 05/15/2016 16:26     DISCHARGE EXAMINATION: Vitals:   05/19/16 0808 05/19/16 0839 05/19/16 1122 05/19/16 1143  BP: 95/73 113/64 93/63 93/63   Pulse: 84 87 77   Resp: (!) 22  (!) 33   Temp: 97.6 F (36.4 C)  98.7 F (37.1 C)   TempSrc: Axillary  Oral   SpO2: 96%  96% 98%  Weight:      Height:       General appearance: alert, cooperative, appears stated age and no distress Resp: clear to auscultation bilaterally Cardio: regular rate and rhythm, S1, S2 normal, no murmur, click, rub or gallop GI: Abdomen is soft. PEG tube is noted. Noted to have a small cylindrical plastic tube sutured to the skin. Unclear what this is. The will need to be addressed at [facility]. Extremities: extremities normal, atraumatic, no cyanosis or edema  DISPOSITION: [facility]  Discharge Instructions    Call MD for:  difficulty breathing, headache or visual disturbances    Complete by:  As directed    Call MD for:  extreme  fatigue    Complete by:  As directed    Call MD for:  persistant dizziness or light-headedness    Complete by:  As directed    Call MD for:  severe uncontrolled pain    Complete by:  As directed    Call MD for:  temperature >100.4    Complete by:  As directed    Discharge instructions    Complete by:  As directed    You were cared for by a hospitalist during your hospital stay. If you have any questions about your discharge medications or the care you received while you were in the hospital after you are discharged, you can call the unit and asked to speak with the hospitalist on call if the hospitalist that took care of you is not available. Once you are discharged, your primary care physician will handle any further medical issues. Please note that NO REFILLS for any discharge medications will be authorized once you are discharged, as it is imperative that you return to your primary care physician (or establish a relationship with a primary care physician if you do not have one) for your aftercare needs so that they can reassess your need for medications and monitor your lab values. If you do not have a primary care physician, you can call [phone] for a physician referral.   Increase activity slowly    Complete by:  As directed       ALLERGIES: No Known Allergies   Current Discharge Medication List    START taking these medications   Details  ceFEPIme 2 g in dextrose 5 % 50 mL Inject 2 g into the vein every 8 (eight) hours. For 5 more days.    furosemide (LASIX) 10 MG/ML solution Place 4 mLs (40 mg total) into feeding tube daily. For 4 more days. Refills: 12    sodium chloride HYPERTONIC 3 % nebulizer solution Take 4 mLs  by nebulization 2 (two) times daily. For 3 days Qty: 24 mL, Refills: 0      CONTINUE these medications which have CHANGED   Details  traMADol (ULTRAM) 50 MG tablet Take 1 tablet (50 mg total) by mouth every 8 (eight) hours. Qty: 15 tablet, Refills: 0        CONTINUE these medications which have NOT CHANGED   Details  acetaminophen (TYLENOL) 325 MG tablet Place 650 mg into feeding tube every 6 (six) hours as needed for moderate pain.     albuterol (ACCUNEB) 1.25 MG/3ML nebulizer solution Take 1 ampule by nebulization every 4 (four) hours as needed for wheezing or shortness of breath.    carvedilol (COREG) 12.5 MG tablet Place 12.5 mg into feeding tube 2 (two) times daily with a meal.    chlorhexidine (PERIDEX) 0.12 % solution Use as directed 15 mLs in the mouth or throat 3 (three) times daily. For trach tube    citalopram (CELEXA) 10 MG tablet Place 10 mg into feeding tube daily.    clonazePAM (KLONOPIN) 2 MG tablet Place 2 mg into feeding tube 3 (three) times daily as needed for anxiety.    enoxaparin (LOVENOX) 40 MG/0.4ML injection Inject 40 mg into the skin daily.    famotidine (PEPCID) 20 MG tablet Place 20 mg into feeding tube 2 (two) times daily.     folic acid (FOLVITE) 1 MG tablet Place 1 mg into feeding tube daily.    insulin aspart (NOVOLOG) 100 UNIT/ML injection Inject 14 Units into the skin 2 (two) times daily.    insulin regular (NOVOLIN R,HUMULIN R) 100 units/mL injection Inject 0-10 Units into the skin 3 (three) times daily before meals. sliding scale    lactulose (CHRONULAC) 10 GM/15ML solution Place 20 g into feeding tube daily as needed for moderate constipation.    loperamide (IMODIUM) 2 MG capsule Take 2 mg by mouth every 6 (six) hours as needed for diarrhea or loose stools.    mirtazapine (REMERON) 7.5 MG tablet Place 7.5 mg into feeding tube at bedtime.    Multiple Vitamin (MULTIVITAMIN WITH MINERALS) TABS tablet Place 1 tablet into feeding tube daily.    ondansetron (ZOFRAN) 4 MG/2ML SOLN injection Inject 4 mg into the vein every 6 (six) hours as needed for nausea or vomiting.     polyethylene glycol (MIRALAX / GLYCOLAX) packet Place 17 g into feeding tube daily as needed for moderate constipation (hold for  diarrhea).    predniSONE (DELTASONE) 5 MG tablet Place 15 mg into feeding tube daily with breakfast.     Protein POWD Give 1 scoop by tube every 6 (six) hours.    scopolamine (TRANSDERM-SCOP, 1.5 MG,) 1 MG/3DAYS Place 1 patch onto the skin every 3 (three) days. For increased secretions    senna-docusate (SENOKOT-S) 8.6-50 MG tablet Place 1 tablet into feeding tube daily.    sodium chloride flush 0.9 % SOLN injection Inject 10 mLs into the vein every 6 (six) hours.    thiamine 100 MG tablet Place 100 mg into feeding tube daily.    !! Valproate Sodium (DEPAKENE) 250 MG/5ML SOLN solution Place 125 mg into feeding tube 2 (two) times daily.     !! Valproate Sodium (DEPAKENE) 250 MG/5ML SOLN solution Place 500 mg into feeding tube at bedtime.    vitamin C (ASCORBIC ACID) 500 MG tablet Place 500 mg into feeding tube daily.    zinc sulfate 220 (50 Zn) MG capsule Place 220 mg into feeding tube daily.      !! -  Potential duplicate medications found. Please discuss with provider.        TOTAL DISCHARGE TIME: 35 mins  Or[facility] Health South Seminole HospitalKRISHNAN,Jozef Eisenbeis  [facility] Pager [phone]  05/19/2016, 1:03 PM

## 2016-05-19 NOTE — Progress Notes (Signed)
CRITICAL VALUE ALERT  Critical value received:MDRO  Date of notification: 05/19/16  Time of notification:  1241  Critical value read back:Yes.    Nurse who received alert:  Jerolyn Centerherry A Alysah Carton   MD notified (1st page):Dr.  Rito EhrlichKrishnan  Time of first page: 1300  MD notified (2nd page):  Time of second page:  Responding MD:  Dr. Rito EhrlichKrishnan  Time MD responded:  [phone]

## 2016-05-19 NOTE — Progress Notes (Signed)
[facility] transportation set up at this time. Dispatcher states it will be after 7p before transportation arrives

## 2016-05-19 NOTE — Care Management Note (Signed)
Case Management Note  Patient Details  Name: Eric Sandoval MRN: [id number] Date of Birth: [birth year]  Subjective/Objective:   Pt is from [facility].  Now qualifies for LTAC and has a bed today per liaison.                               Expected Discharge Plan:  Long Term Acute Care (LTAC)  Discharge planning Services  CM Consult  Status of Service:  Completed, signed off  Magdalene RiverMayo, Ennio Houp T, CaliforniaRN 05/19/2016, 12:52 PM

## 2016-05-19 NOTE — Progress Notes (Signed)
[facility]

## 2016-05-19 NOTE — Progress Notes (Signed)
Name: Eric Sandoval MRN: [id number] DOB: 06/15/1956    ADMISSION DATE:  05/15/2016 CONSULTATION DATE:  05/15/2016  REFERRING MD :  Dr. Katrinka BlazingSmith [facility]  CHIEF COMPLAINT:  AMS  BRIEF PATIENT DESCRIPTION: 60 y.o. male with chronic trach/vent/PEG suffered brief cardiac arrest at [facility]. Responsive and following commands in ED. Complete opacification of L hemithorax on CXR. PCCM to consult for PNA and vent management.  SUBJECTIVE: RT reporting patient requiring high tracheostomy collar pressures with occasional leaking around tracheostomy. Patient's tracheostomy cuff is partially deflated. Pt currently on 30%, PEEP of 5, Rate of 12 and TV of 600. Speaking around trach. Secretions are copious ,thick but clear.  REVIEW OF SYSTEMS:  Unable to obtain as patient is currently on ventilator with tracheostomy in place.  VITAL SIGNS: Temp:  [97.6 F (36.4 C)-100.7 F (38.2 C)] 97.6 F (36.4 C) (11/24 0808) Pulse Rate:  [78-100] 87 (11/24 0839) Resp:  [20-31] 22 (11/24 0808) BP: (78-113)/(51-92) 113/64 (11/24 0839) SpO2:  [96 %-100 %] 96 % (11/24 0808) FiO2 (%):  [30 %] 30 % (11/24 0808) Weight:  [157 lb 6.5 oz (71.4 kg)] 157 lb 6.5 oz (71.4 kg) (11/24 0500)  PHYSICAL EXAMINATION: General:  Awake. Watching TV. No family at bedside.  Integument:  Warm & dry. No rash on exposed skin.  HEENT:  Moist mucus membranes. Tracheostomy in place with a Shiley #7 cuffed. Cardiovascular:  Regular rate. No appreciable JVD. Pulmonary:  Good aeration bilaterally anteriorly. Normal work of breathing on MV. Patient losing approximately 200-300 mL of tidal volume on exhalation. Speaking around trach. Abdomen: Soft. Normal bowel sounds. Nondistended.  Neurological: Patient attends to voice and attempts to answer questions but is unable to be understood. Moving upper extremities equally.    Recent Labs Lab 05/17/16 0442 05/18/16 0647 05/19/16 0345  NA 136 140 139  K 3.8 3.9 3.5  CL 98* 100* 96*  CO2 29  30 31   BUN 11 18 23*  CREATININE 0.58* 0.61 0.64  GLUCOSE 155* 240* 249*    Recent Labs Lab 05/17/16 0442 05/18/16 0647 05/19/16 0345  HGB 9.1* 9.0* 8.6*  HCT 29.9* 28.7* 27.8*  WBC 11.0* 12.1* 14.0*  PLT 398 400 398   No results found. SIGNIFICANT EVENTS:  11/20 - Admit  STUDIES:  CT HEAD W/O 11/21:  No acute abnormality. Chronic microvascular ischemia.  Port CXR 11/22:  Personally reviewed by me. Improved aeration left upper lung when compared with previous chest x-ray from 11/20. Continued hazy bilateral lower lung opacities suggestive of pleural effusions. TTE 11/23: LVEF 55-60% with normal diastolic function. No regional wall motion abnormalities. LA normal in size & RA upper limits of normal in size. RV mildly dilated with low normal systolic function. No aortic regurgitation. Aortic root normal in size. Trivial mitral regurgitation. No significant pulmonic regurgitation. Trivial tricuspid regurgitation. No pericardial effusion.  MICROBIOLOGY: Blood Ctx x2 11/20 >> Urine Ctx 11/20:  Enterococcus & E coli Tracheal Asp Ctx 11/20 >> Moderate Acinetobacter baumanni w/ sensitivities pending MRSA PCR 11/20:  Positive  ANTIBIOTICS: Cefepime 11/20 >> Vancomycin 11/20 >>  ASSESSMENT / PLAN:  60 y.o. male with ventilator dependence status post tracheostomy and acute on chronic hypoxic respiratory failure that is likely multifactorial. Patient is culturing out Acinetobacter from his tracheal aspirate. Aeration of his left lung seems to be improving. Patient does have several cuff leak from his tracheostomy. Due to his tracheostomy being an abnormal type he will require dilation to a standard #6 and we will have to  order a distal XLT. Continued low grade temp.  1. Acute on Chronic Hypoxic Respiratory Failure:Continue airway clearance with 3% hypertonic saline nebulized twice daily & albuterol nebulized every 6 hours. Recommend continue diuresis with Lasix as renal function allows.    2. HCAP:  Awaiting sensitivities for tracheal aspirate culture.( Pending 11/24) Continuing on empiric vancomycin and cefepime. Intermittent  CXR"s as needed.  3. Ventilator Dependence/Tracheostomy:  There is a cuff leak from patient's tracheostomy. He has a #7 distal XLT with a significantly small OD than our stocked tracheostomies due to his current tracheostomy being a single lumen. Patient would re-quire up-sizing to a standard #6 tracheostomy diameter and order a distal XLT Shiley #6 for replacement. Plan for dilation as soon as replacement arrives and one of our Trach MD's is available to make the switch. Janina Mayorach is in the room 11/24.   As patient is stable, will Plan to replace on Monday when support staff is available.  Remainder of care as per primary service.   Bevelyn NgoSarah F. Groce, AGACNP-BC [facility] Pager # [phone]  11:11 AM 05/19/16   Attending Note:  I have examined patient, reviewed labs, studies and notes. I have discussed the case with Saralyn PilarS Groce, and I agree with the data and plans as amended above. Chronic trach and vent dependence,admitted with HCAP. Improved secretions and resp status. He has a large cuff leak, can speak around trach, but tolerates well. Certainly reasonable to electively replace trace with a #6 distal XLT Shiley so that he has adequate cuff pressures, although suspect that he actually has chronically preferred being able to speak around trach. Another option would be to send him back to [facility] with existing trach. he will require serial dilations to accomplish #6. Sindy Guadelouperachs are here and available. Will ask our Trach MD's to assess next week for placement [facility].    Levy Pupaobert Mohmed Farver, MD, PhD 05/19/2016, 12:09 PM [facility] [phone] or if no answer [phone]

## 2016-05-19 NOTE — Progress Notes (Signed)
A woman called to check status of pt and identified herself as patients sister Eric Sandoval. No password had been set up so limited information was given. Woman was informed that pt was stable and had plans to discharge to [facility] later today. No further details given

## 2016-05-20 LAB — CULTURE, BLOOD (ROUTINE X 2)
Culture: NO GROWTH
Culture: NO GROWTH

## 2016-12-16 IMAGING — CR DG ABD PORTABLE 1V
1 series · 1 of 1 positions shown · non-contrast
Comparison: None.

CLINICAL DATA: Tube placement

EXAM:
PORTABLE ABDOMEN - 1 VIEW

[AP]
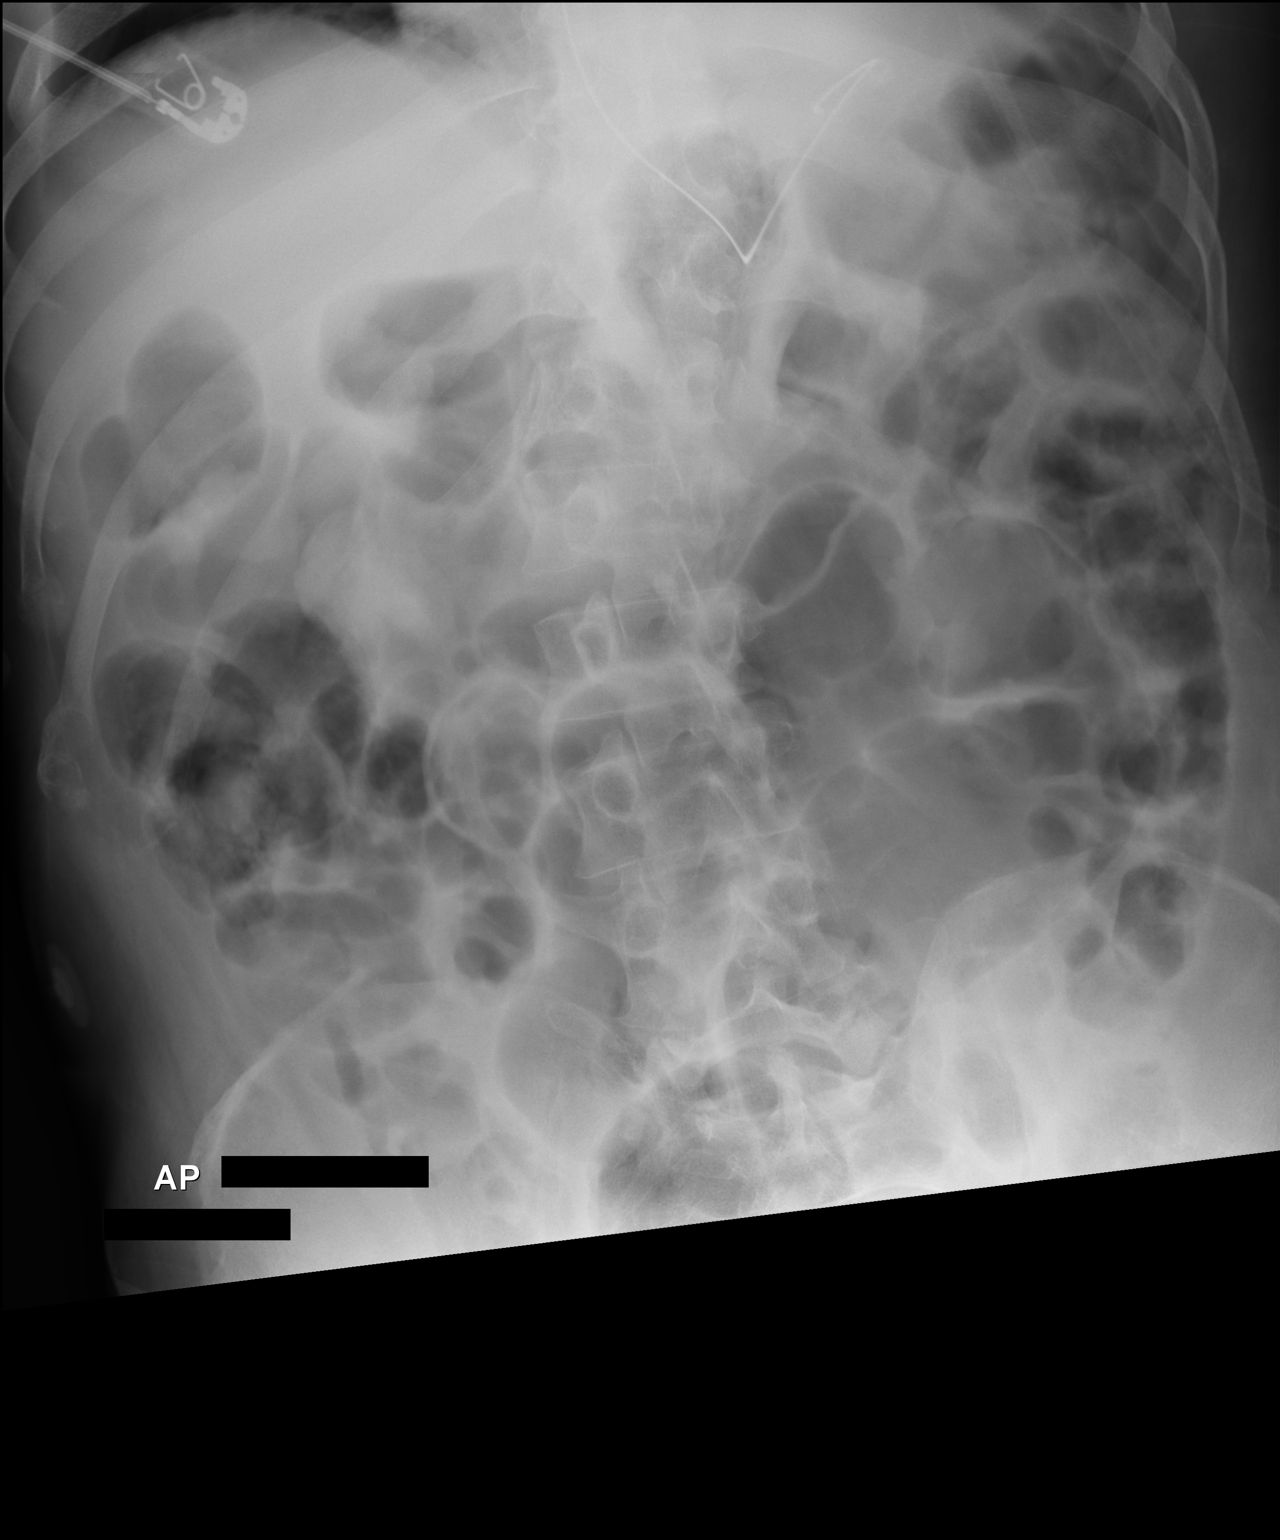

[1 of 1 positions shown; findings below may reference images not displayed]

FINDINGS: Esophageal tube is below the diaphragm, the tip is folded back upon
itself and overlies the gastric fundus. Mild to moderate diffuse
gaseous dilatation of the bowel. No pathologic calcification.
IMPRESSION: 1. Esophageal tube tip is folded back upon itself and position over
the gastric fundus
2. Diffuse gaseous dilatation of the bowel could relate to an ileus.

## 2017-01-09 IMAGING — CR DG CHEST 1V PORT
1 series · 1 of 1 positions shown · non-contrast
Comparison: None.

CLINICAL DATA: Unresponsive. Ventilated patient with tracheostomy
tube.

EXAM:
PORTABLE CHEST 1 VIEW

[AP]
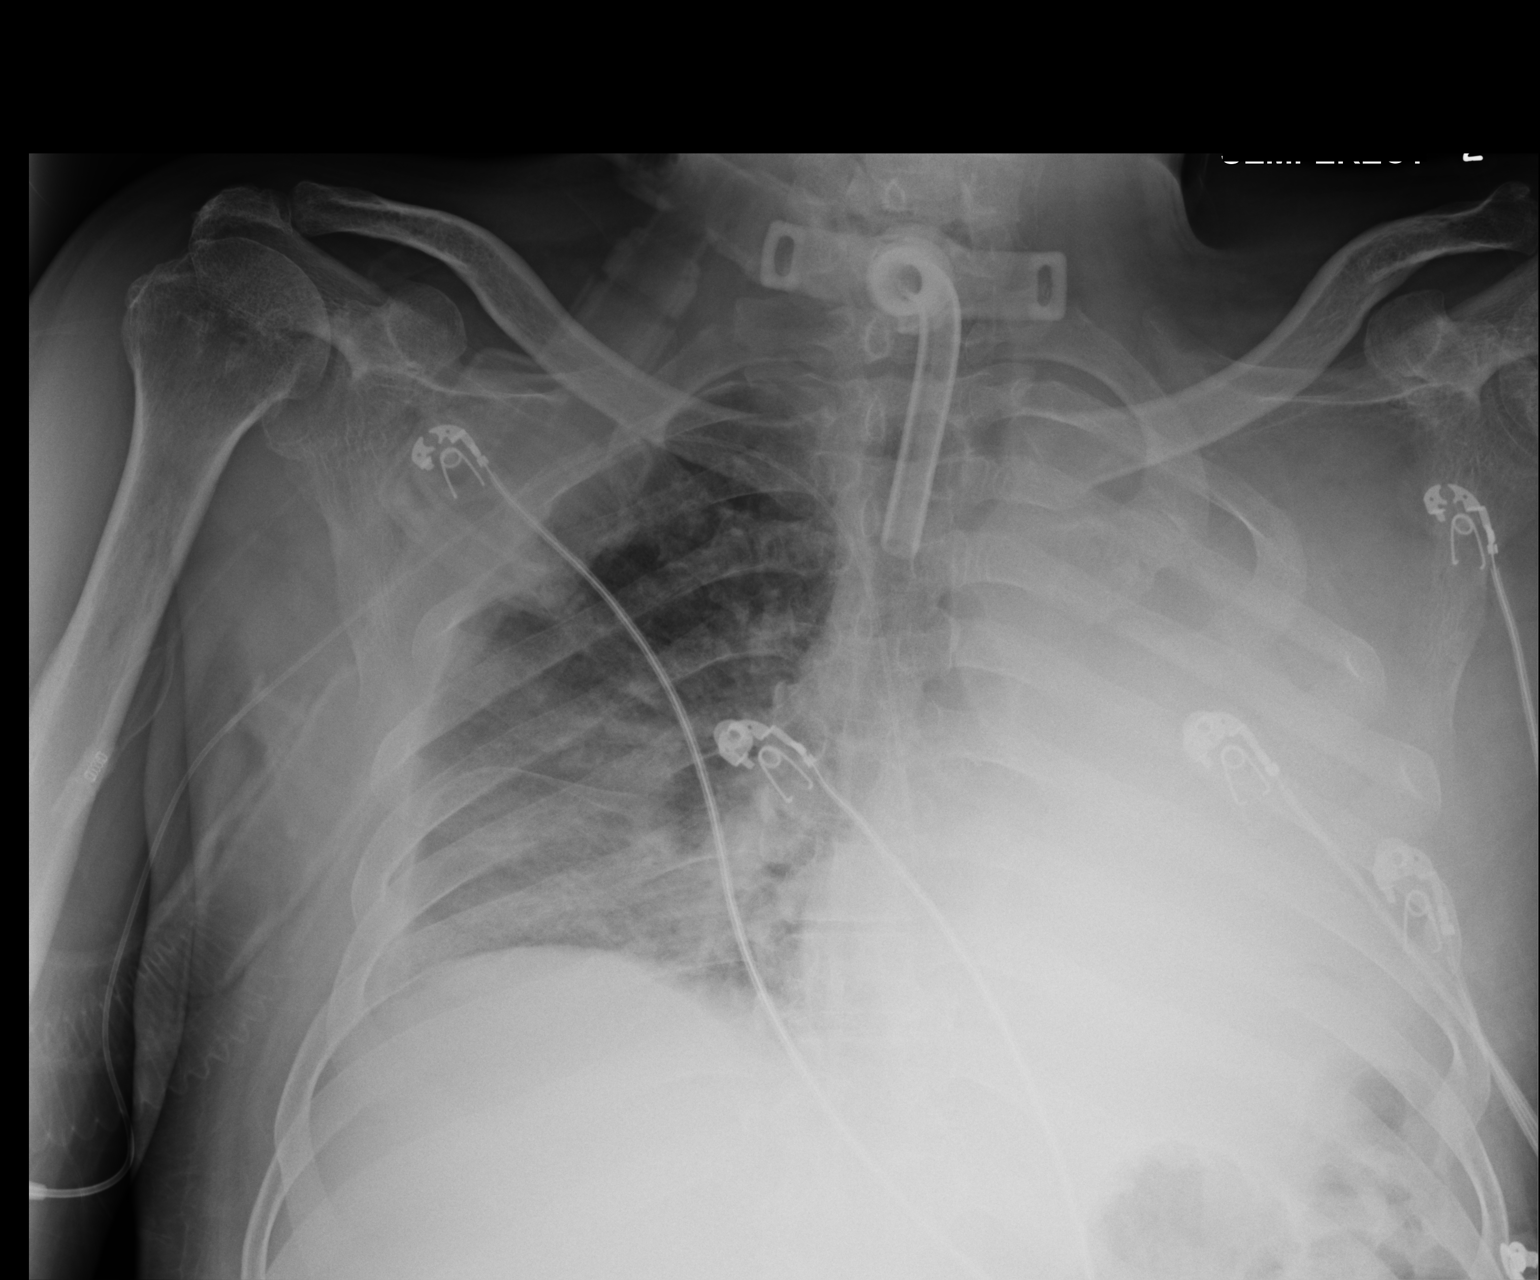

[1 of 1 positions shown; findings below may reference images not displayed]

FINDINGS: Complete opacification of left hemithorax. There appears to be
volume loss in left hemithorax suggesting left lung collapse and
possibly related to mucous plugging. However, large left pleural
effusion cannot be excluded. Diffuse airspace densities in the right
lung may represent edema. PICC line tip near the superior cavoatrial
junction. Tracheostomy tube is present. Heart is obscured by the
left chest densities.
IMPRESSION: Complete opacification of the left hemithorax probably related to
collapse of the left lung from mucous plugging. Massive left pleural
effusion cannot be excluded.

Right lung airspace disease may represent pulmonary edema.

## 2017-01-10 IMAGING — CT CT HEAD W/O CM
3 of 4 series · 17 of 47 positions shown, 20 images · non-contrast
Comparison: None.

CLINICAL DATA: Unresponsive patient.  Status post CPR.

EXAM:
CT HEAD WITHOUT CONTRAST
TECHNIQUE: Contiguous axial images were obtained from the base of the skull
through the vertex without intravenous contrast.

[Series 201: head w/o, idose (1) · axial · non-contrast · 0.45mm/px · z∈[-41,+99]mm · 11 of 34 slices shown, 14 images]
[im 3/34  brain]
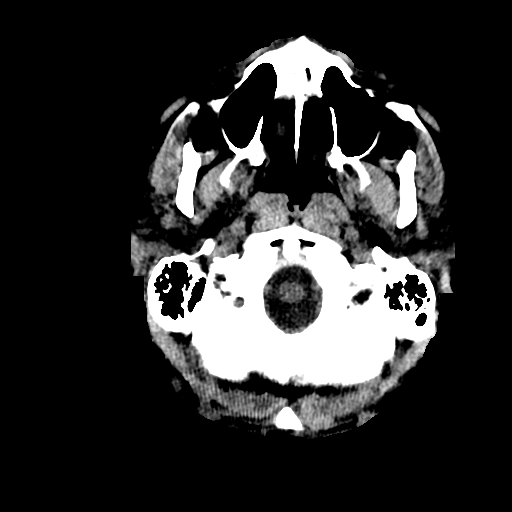
[im 3/34  bone]
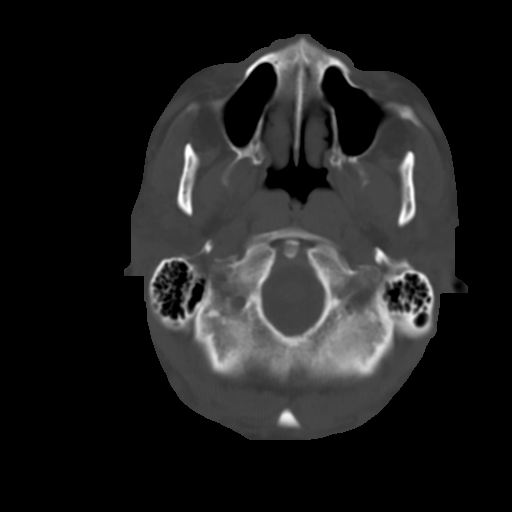
[im 5/34  brain]
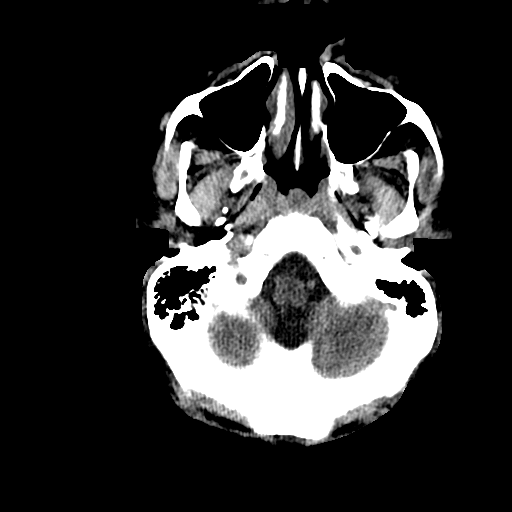
[im 8/34  brain]
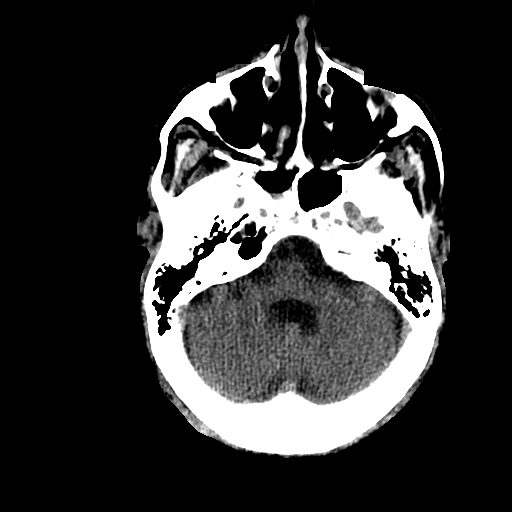
[im 12/34  brain]
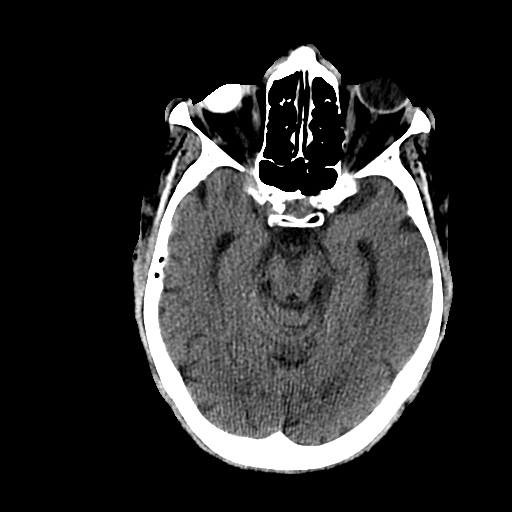
[im 15/34  brain]
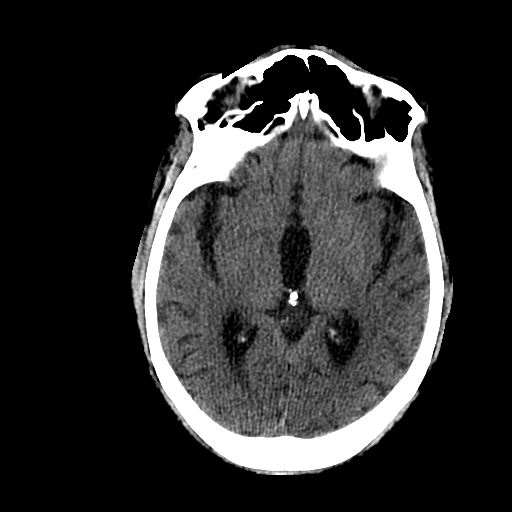
[im 15/34  bone]
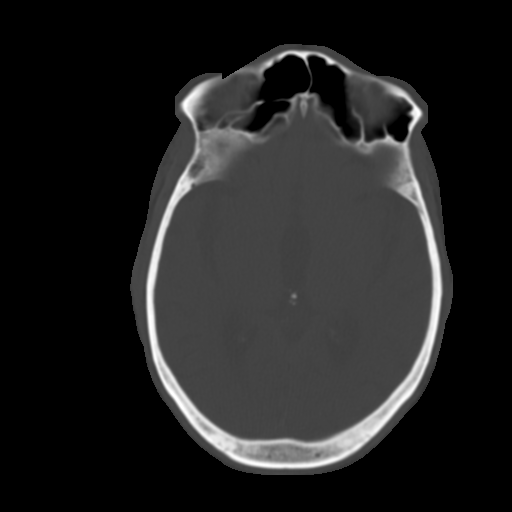
[im 17/34  brain]
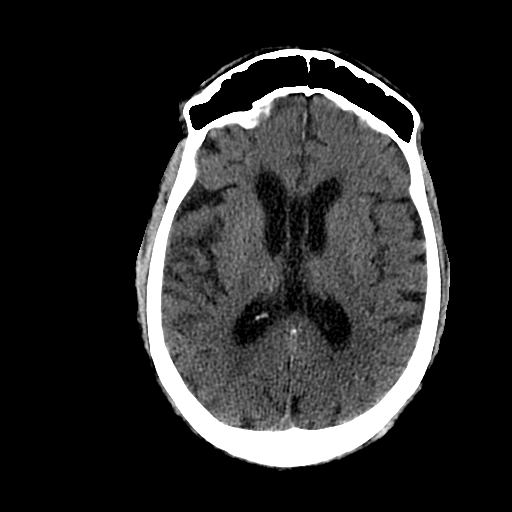
[im 19/34  brain]
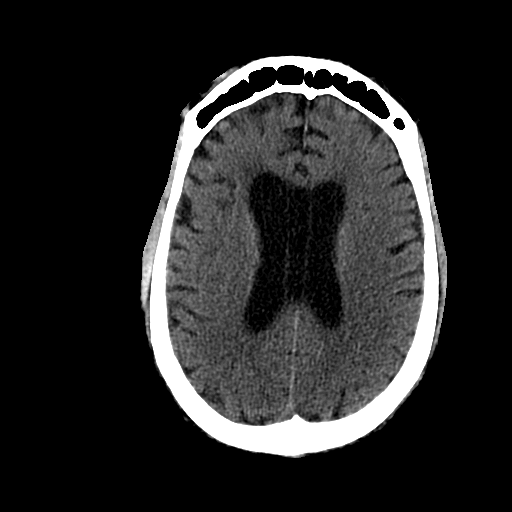
[im 22/34  brain]
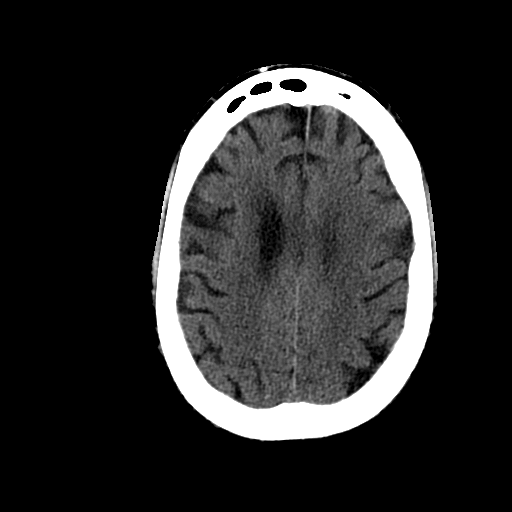
[im 26/34  brain]
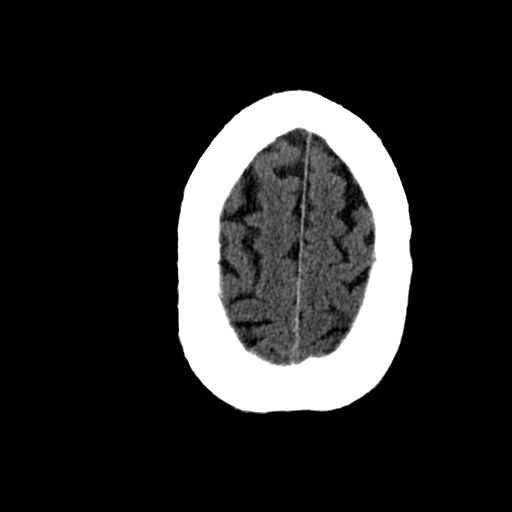
[im 26/34  bone]
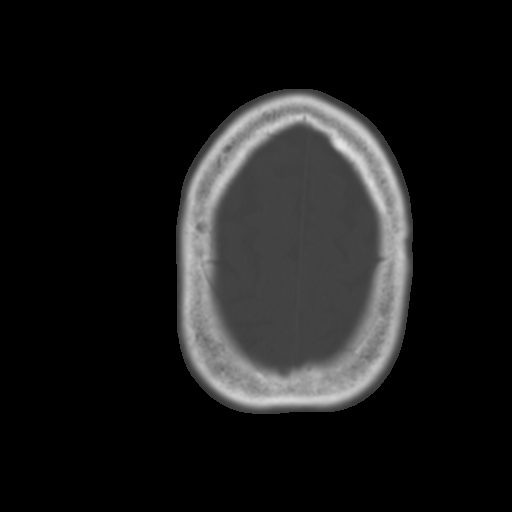
[im 29/34  brain]
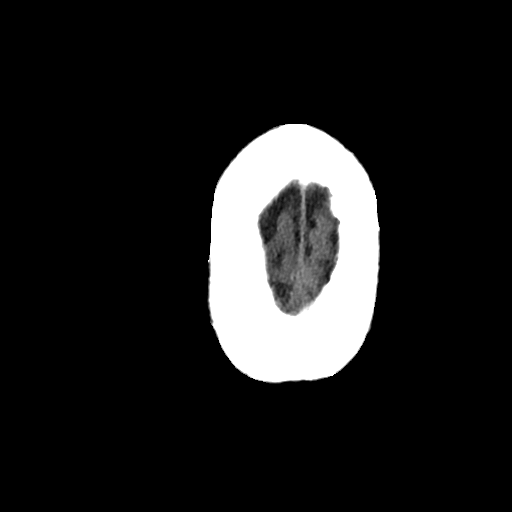
[im 31/34  brain]
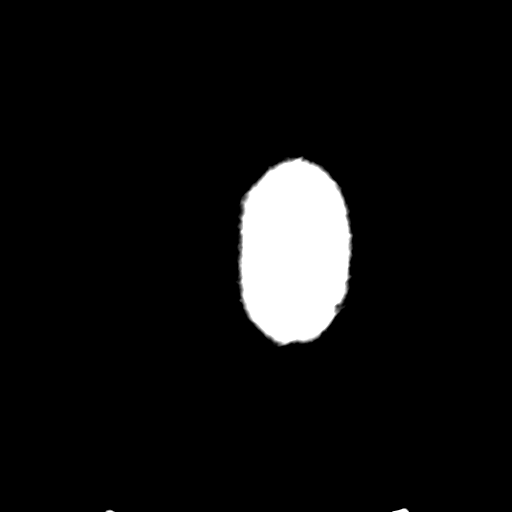

[Series 203: coronal st, idose (1) · coronal · 0.40mm/px · 3 of 76 slices shown]
[im 26/76  brain]
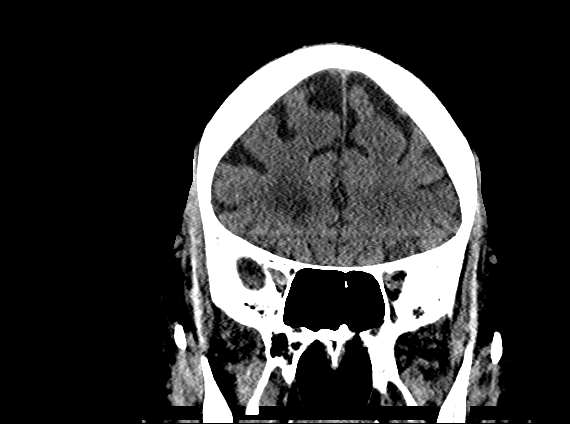
[im 34/76  brain]
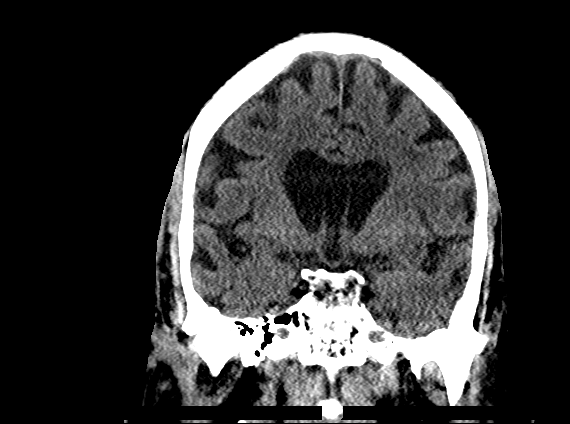
[im 42/76  brain]
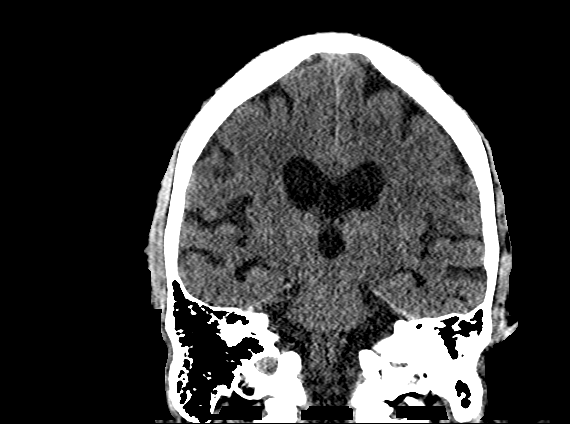

[Series 204: sagittal st, idose (1) · sagittal · 0.40mm/px · 3 of 76 slices shown]
[im 26/76  brain]
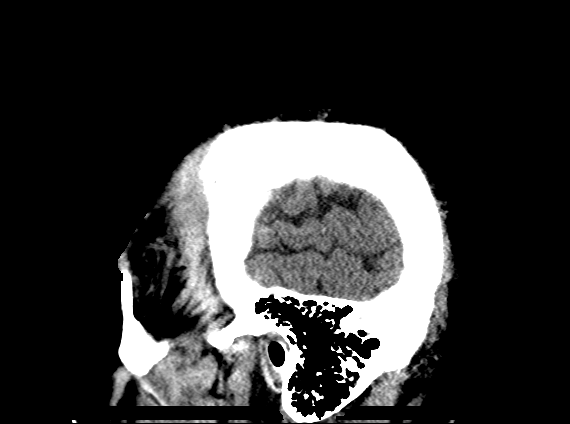
[im 38/76  brain]
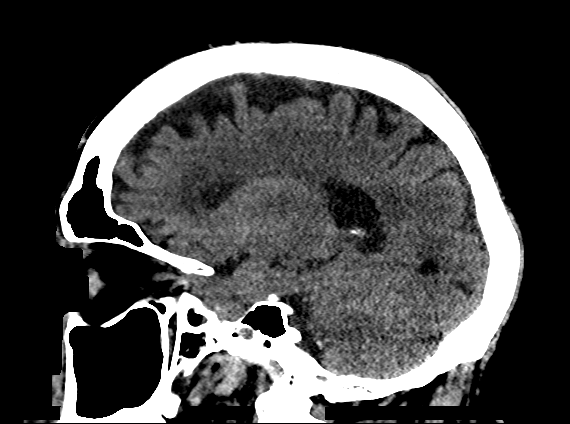
[im 51/76  brain]
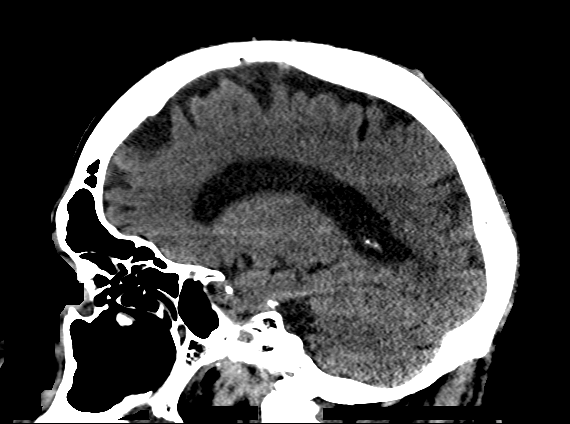

[17 of 47 positions shown; findings below may reference images not displayed]

FINDINGS: Brain: There is no midline shift or mass effect. There is no
extra-axial collection. No intraparenchymal or extra-axial
hemorrhage. Incidentally noted is a cavum septum pellucidum et
vergae. There is no CT evidence of acute cortical infarct. The basal
ganglia and insular cortices are preserved. No hydrocephalus. There
is periventricular hypoattenuation compatible with chronic
microvascular disease.

Vascular: Atherosclerotic calcification of the internal carotid
arteries is present at the skullbase.

Skull: No skull fracture or focal calvarial lesion. Visualized skull
base is normal.

Sinuses/Orbits: Fluid level in the right sphenoid sinus. Otherwise
clear paranasal sinuses and mastoids. There is a right eye
prosthesis. The left orbit is normal.

Other: None
IMPRESSION: 1. No acute intracranial abnormality.
2. Findings of chronic microvascular ischemia.

## 2017-02-24 DEATH — deceased
